# Patient Record
Sex: Female | Born: 1975 | Race: White | Hispanic: No | Smoking: Never smoker
Health system: Southern US, Community
[De-identification: ages and names within clinical notes are randomized; demographics above are authoritative.]

## PROBLEM LIST (undated history)

## (undated) DIAGNOSIS — R112 Nausea with vomiting, unspecified: Secondary | ICD-10-CM

## (undated) DIAGNOSIS — E039 Hypothyroidism, unspecified: Secondary | ICD-10-CM

## (undated) DIAGNOSIS — Z8489 Family history of other specified conditions: Secondary | ICD-10-CM

## (undated) DIAGNOSIS — R011 Cardiac murmur, unspecified: Secondary | ICD-10-CM

## (undated) DIAGNOSIS — Z9889 Other specified postprocedural states: Secondary | ICD-10-CM

## (undated) DIAGNOSIS — N809 Endometriosis, unspecified: Secondary | ICD-10-CM

## (undated) DIAGNOSIS — R339 Retention of urine, unspecified: Secondary | ICD-10-CM

## (undated) DIAGNOSIS — M2550 Pain in unspecified joint: Secondary | ICD-10-CM

## (undated) DIAGNOSIS — J189 Pneumonia, unspecified organism: Secondary | ICD-10-CM

## (undated) DIAGNOSIS — R51 Headache: Secondary | ICD-10-CM

## (undated) DIAGNOSIS — F419 Anxiety disorder, unspecified: Secondary | ICD-10-CM

## (undated) HISTORY — PX: TUBAL LIGATION: SHX77

## (undated) HISTORY — PX: DIAGNOSTIC LAPAROSCOPY: SUR761

## (undated) HISTORY — PX: MM BONE DENSITY DEX AXIAL/SKEL (ARMC HX): HXRAD1060

## (undated) HISTORY — PX: BACK SURGERY: SHX140

## (undated) HISTORY — PX: TONSILLECTOMY: SUR1361

---

## 1997-09-11 ENCOUNTER — Other Ambulatory Visit: Admission: RE | Admit: 1997-09-11 | Discharge: 1997-09-11 | Payer: Self-pay | Admitting: Obstetrics and Gynecology

## 1997-10-23 ENCOUNTER — Ambulatory Visit (HOSPITAL_COMMUNITY): Admission: RE | Admit: 1997-10-23 | Discharge: 1997-10-23 | Payer: Self-pay | Admitting: Obstetrics and Gynecology

## 1997-10-24 ENCOUNTER — Other Ambulatory Visit: Admission: RE | Admit: 1997-10-24 | Discharge: 1997-10-24 | Payer: Self-pay | Admitting: Family Medicine

## 1997-11-02 ENCOUNTER — Ambulatory Visit (HOSPITAL_COMMUNITY): Admission: RE | Admit: 1997-11-02 | Discharge: 1997-11-02 | Payer: Self-pay | Admitting: Obstetrics and Gynecology

## 2000-01-04 ENCOUNTER — Other Ambulatory Visit: Admission: RE | Admit: 2000-01-04 | Discharge: 2000-01-04 | Payer: Self-pay | Admitting: Obstetrics and Gynecology

## 2001-04-17 ENCOUNTER — Other Ambulatory Visit: Admission: RE | Admit: 2001-04-17 | Discharge: 2001-04-17 | Payer: Self-pay | Admitting: Obstetrics and Gynecology

## 2001-10-02 ENCOUNTER — Inpatient Hospital Stay (HOSPITAL_COMMUNITY): Admission: AD | Admit: 2001-10-02 | Discharge: 2001-10-02 | Payer: Self-pay | Admitting: Obstetrics and Gynecology

## 2001-10-03 ENCOUNTER — Inpatient Hospital Stay (HOSPITAL_COMMUNITY): Admission: AD | Admit: 2001-10-03 | Discharge: 2001-10-03 | Payer: Self-pay | Admitting: *Deleted

## 2001-10-15 ENCOUNTER — Inpatient Hospital Stay (HOSPITAL_COMMUNITY): Admission: AD | Admit: 2001-10-15 | Discharge: 2001-10-15 | Payer: Self-pay | Admitting: Obstetrics and Gynecology

## 2001-11-12 ENCOUNTER — Inpatient Hospital Stay (HOSPITAL_COMMUNITY): Admission: AD | Admit: 2001-11-12 | Discharge: 2001-11-14 | Payer: Self-pay | Admitting: Obstetrics and Gynecology

## 2001-12-20 ENCOUNTER — Other Ambulatory Visit: Admission: RE | Admit: 2001-12-20 | Discharge: 2001-12-20 | Payer: Self-pay | Admitting: Obstetrics and Gynecology

## 2003-05-05 ENCOUNTER — Other Ambulatory Visit: Admission: RE | Admit: 2003-05-05 | Discharge: 2003-05-05 | Payer: Self-pay | Admitting: Obstetrics and Gynecology

## 2004-11-28 ENCOUNTER — Inpatient Hospital Stay (HOSPITAL_COMMUNITY): Admission: AD | Admit: 2004-11-28 | Discharge: 2004-11-30 | Payer: Self-pay | Admitting: Obstetrics and Gynecology

## 2007-01-24 ENCOUNTER — Encounter: Admission: RE | Admit: 2007-01-24 | Discharge: 2007-01-31 | Payer: Self-pay | Admitting: Family Medicine

## 2008-05-22 HISTORY — PX: CHOLECYSTECTOMY: SHX55

## 2008-06-11 ENCOUNTER — Encounter: Admission: RE | Admit: 2008-06-11 | Discharge: 2008-06-11 | Payer: Self-pay | Admitting: Family Medicine

## 2009-07-21 IMAGING — US US ABDOMEN COMPLETE
1 series · 13 of 25 positions shown · non-contrast
Comparison: None

CLINICAL DATA: 2 months right upper quadrant pain. History
endometriosis.

ABDOMEN ULTRASOUND
TECHNIQUE: Complete abdominal ultrasound examination was performed
including evaluation of the liver, gallbladder, bile ducts,
pancreas, kidneys, spleen, IVC, and abdominal aorta.

[Series 1: us abdomen complete · 0.23mm/px · 13 of 97 slices shown]
[im 1/97]
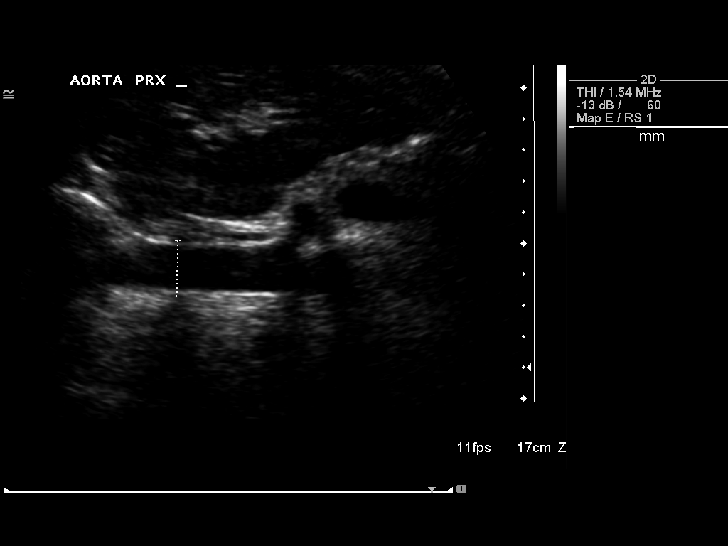
[im 9/97]
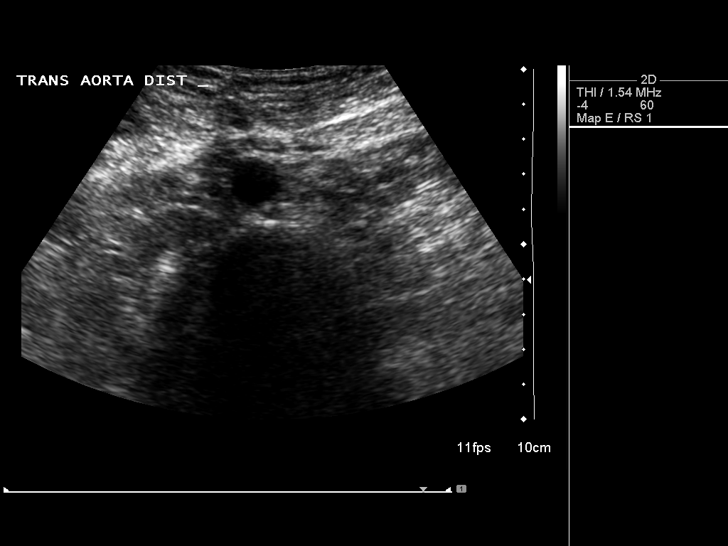
[im 17/97]
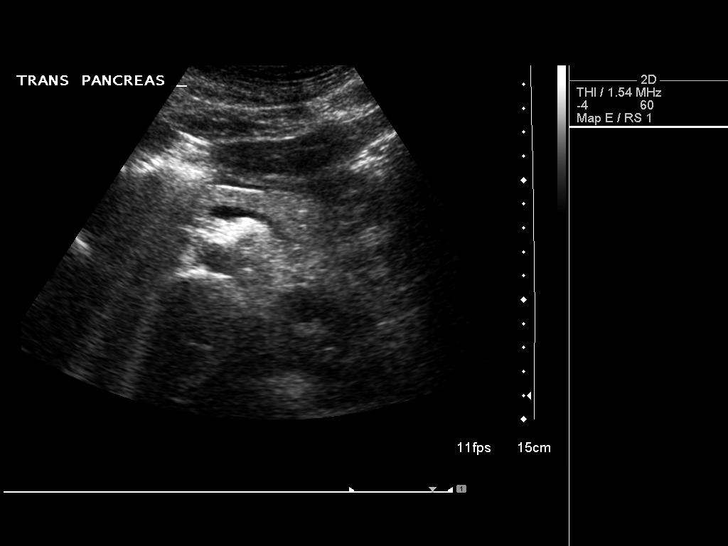
[im 25/97]
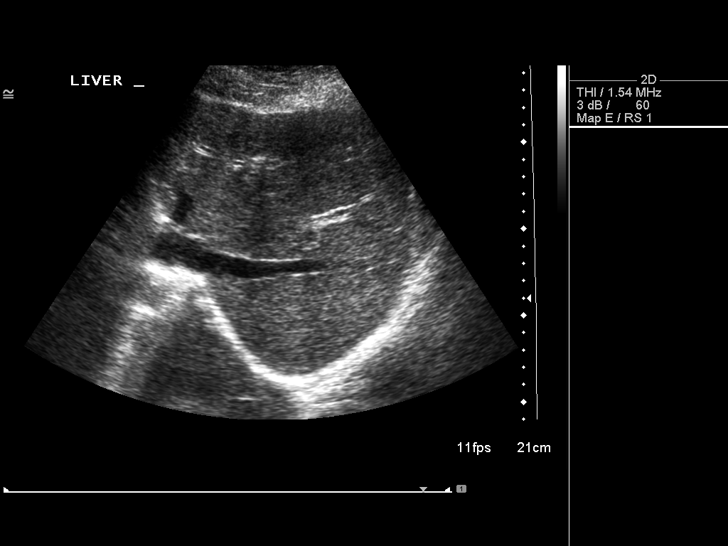
[im 33/97]
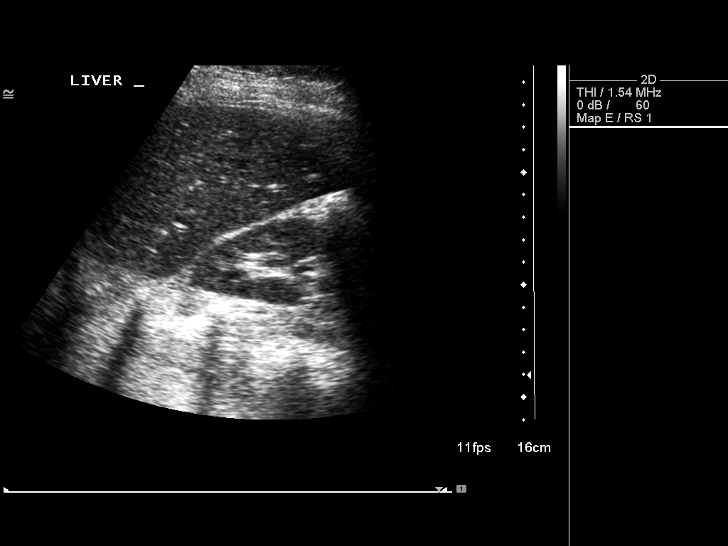
[im 41/97]
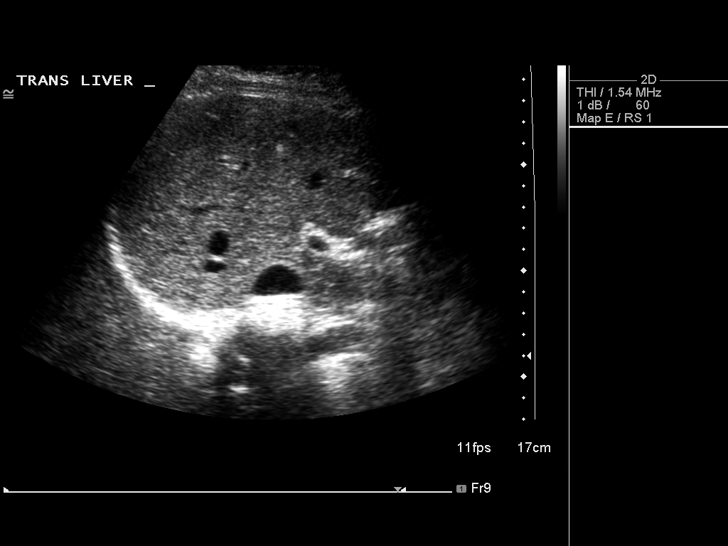
[im 49/97]
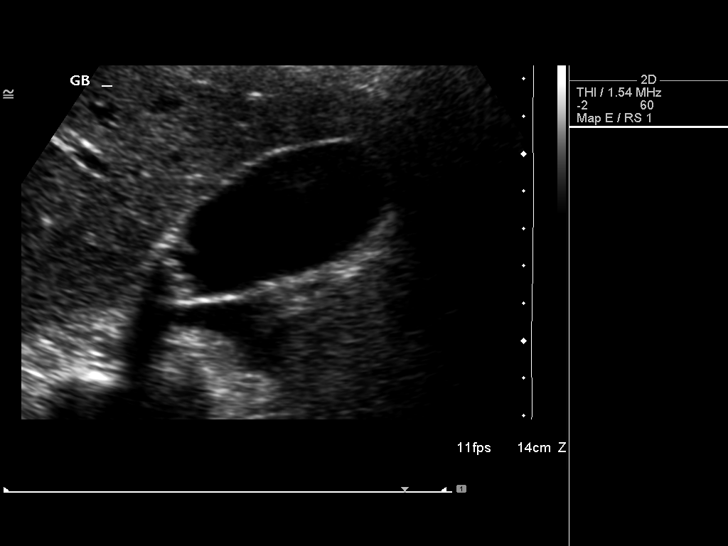
[im 57/97]
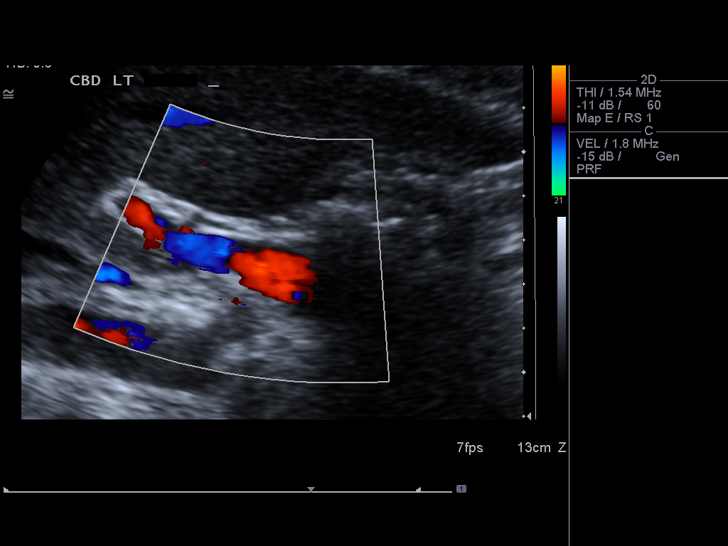
[im 65/97]
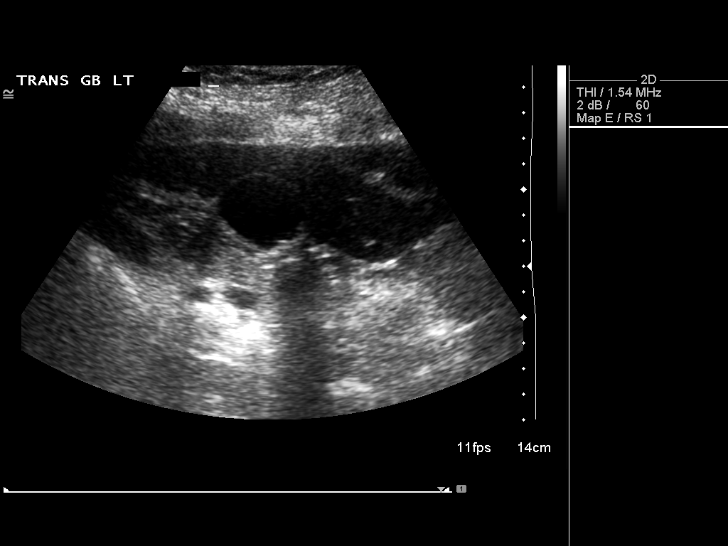
[im 73/97]
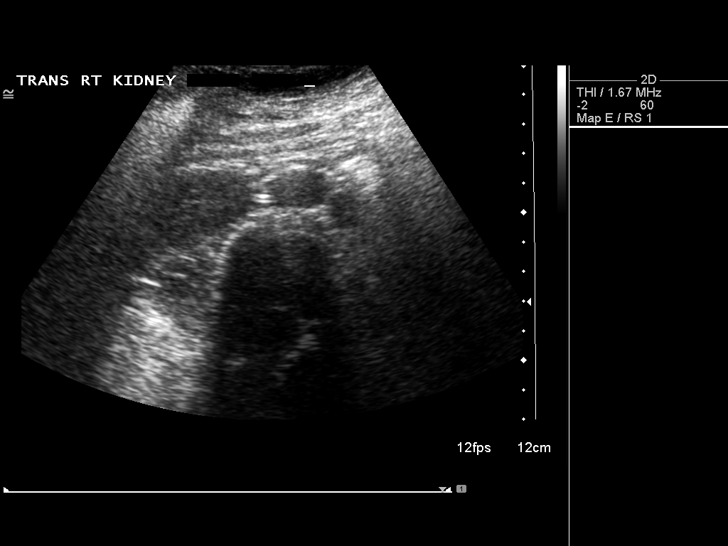
[im 81/97]
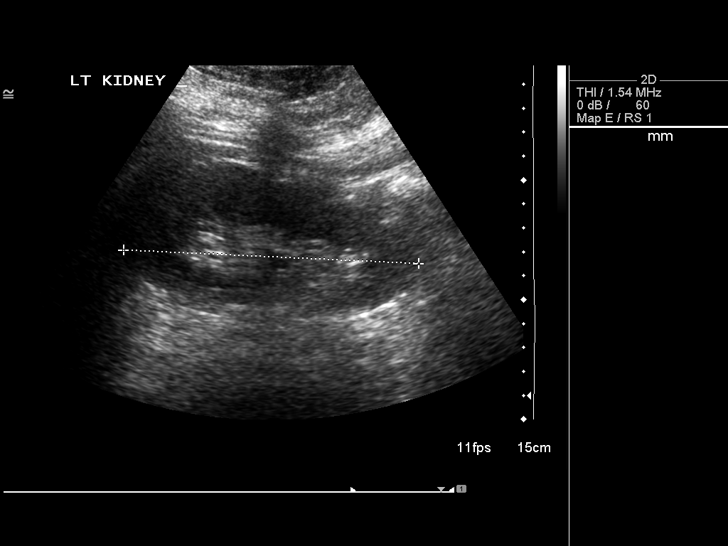
[im 89/97]
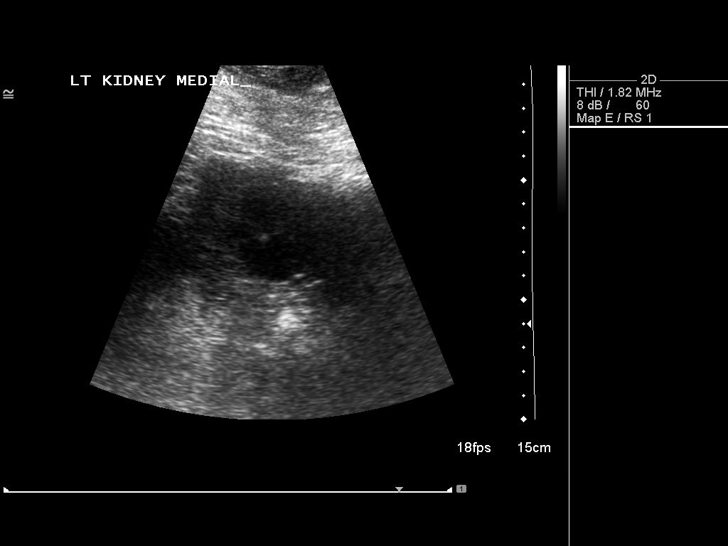
[im 97/97]
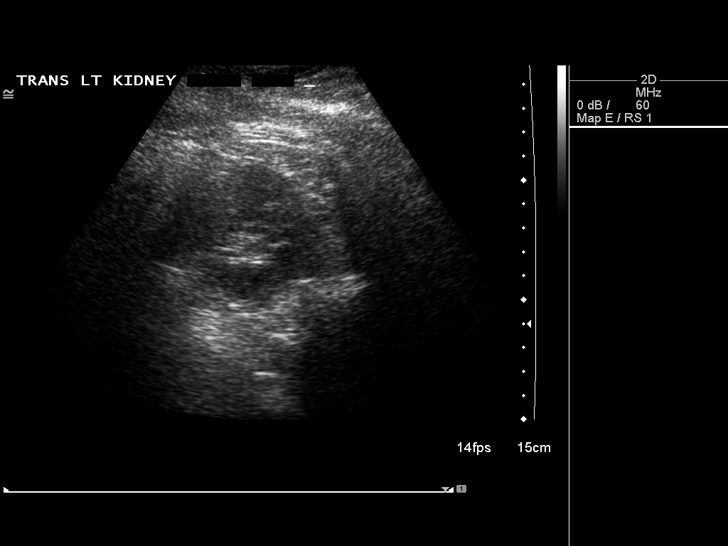

[13 of 25 positions shown; findings below may reference images not displayed]

FINDINGS: Slight (3 mm and less) nonobstructing gallstones are
seen.  Gallbladder is otherwise sonographically normal with wall
thickness 1 mm and no sonographic Murphy's sign.  No dilated
intrahepatic or extrahepatic bile ducts are seen with common bile
duct measuring normally at 2 mm.  At the central right lobe liver
is marginated solitary 5 mm echogenic focus consistent with
incidental hemangioma.  Liver size is normal with no other focal
lesions.  Pancreatic head and tail were obscured due to overlying
gastrointestinal gas.  Remaining inferior vena cava, pancreas,
spleen (7.1 cm long), right kidney (12 cm long), and abdominal
aorta (maximum diameter 1.7 cm) appear sonographically normal.  At
the medial mid left kidney is either anatomic variant extrarenal
pelvis measuring up to 2.7 cm long X 1.9 cm AP X 2.3 cm wide versus
medial exophytic cyst.  No free fluid seen.
IMPRESSION: 1.  Nonobstructing cholelithiasis.
2.  Limited visualization pancreatic head and tail.
3.  Probable incidental 5 mm liver hemangioma.  Recommend follow-up
targeted liver ultrasound in 6-12 months for further evaluation.
4.  Probable anatomic variant extrarenal pelvis mid left kidney
with 2.7 cm medial exophytic renal cyst possible.
5.  Otherwise, negative.

## 2011-12-14 ENCOUNTER — Other Ambulatory Visit: Payer: Self-pay | Admitting: Obstetrics and Gynecology

## 2011-12-22 ENCOUNTER — Encounter (HOSPITAL_COMMUNITY): Payer: Self-pay

## 2011-12-26 ENCOUNTER — Encounter (HOSPITAL_COMMUNITY): Payer: Self-pay

## 2011-12-26 ENCOUNTER — Encounter (HOSPITAL_COMMUNITY)
Admission: RE | Admit: 2011-12-26 | Discharge: 2011-12-26 | Disposition: A | Discharge status: Home / Self Care | Payer: Commercial Managed Care - PPO | Source: Ambulatory Visit | Attending: Obstetrics and Gynecology | Admitting: Obstetrics and Gynecology

## 2011-12-26 HISTORY — DX: Cardiac murmur, unspecified: R01.1

## 2011-12-26 HISTORY — DX: Anxiety disorder, unspecified: F41.9

## 2011-12-26 HISTORY — DX: Headache: R51

## 2011-12-26 HISTORY — DX: Other specified postprocedural states: Z98.890

## 2011-12-26 HISTORY — DX: Nausea with vomiting, unspecified: R11.2

## 2011-12-26 HISTORY — DX: Hypothyroidism, unspecified: E03.9

## 2011-12-26 LAB — CBC
Hemoglobin: 11.8 g/dL — ABNORMAL LOW (ref 12.0–15.0)
Platelets: 261 10*3/uL (ref 150–400)
RBC: 3.9 MIL/uL (ref 3.87–5.11)
WBC: 4.9 10*3/uL (ref 4.0–10.5)

## 2011-12-26 LAB — SURGICAL PCR SCREEN
MRSA, PCR: NEGATIVE
Staphylococcus aureus: NEGATIVE

## 2011-12-26 NOTE — Patient Instructions (Addendum)
Your procedure is scheduled on:01/04/12  Enter through the Main Entrance at :1130 am Pick up desk phone and dial [phone] and inform us of your arrival.  Please call [phone] if you have any problems the morning of surgery.  Remember: Do not eat after midnight:Wed. Do not drink after:9am on Thursday- see clear liquids sheet  Take these meds the morning of surgery with a sip of water: Synthroid  DO NOT wear jewelry, eye make-up, lipstick,body lotion, or dark fingernail polish. Do not shave for 48 hours prior to surgery.  If you are to be admitted after surgery, leave suitcase in car until your room has been assigned. Patients discharged on the day of surgery will not be allowed to drive home.   Remember to use your Hibiclens as instructed.

## 2012-01-04 ENCOUNTER — Encounter (HOSPITAL_COMMUNITY)
Admission: RE | Disposition: A | Discharge status: Home / Self Care | Payer: Self-pay | Source: Ambulatory Visit | Attending: Obstetrics and Gynecology

## 2012-01-04 ENCOUNTER — Ambulatory Visit (HOSPITAL_COMMUNITY)
Admission: RE | Admit: 2012-01-04 | Discharge: 2012-01-04 | Disposition: A | Discharge status: Home / Self Care | Payer: Commercial Managed Care - PPO | Source: Ambulatory Visit | Attending: Obstetrics and Gynecology | Admitting: Obstetrics and Gynecology

## 2012-01-04 ENCOUNTER — Encounter (HOSPITAL_COMMUNITY): Payer: Self-pay | Admitting: Anesthesiology

## 2012-01-04 ENCOUNTER — Encounter (HOSPITAL_COMMUNITY): Payer: Self-pay | Admitting: *Deleted

## 2012-01-04 ENCOUNTER — Ambulatory Visit (HOSPITAL_COMMUNITY): Payer: Commercial Managed Care - PPO | Admitting: Anesthesiology

## 2012-01-04 DIAGNOSIS — N803 Endometriosis of pelvic peritoneum, unspecified: Secondary | ICD-10-CM | POA: Insufficient documentation

## 2012-01-04 DIAGNOSIS — Z01812 Encounter for preprocedural laboratory examination: Secondary | ICD-10-CM | POA: Insufficient documentation

## 2012-01-04 DIAGNOSIS — R1031 Right lower quadrant pain: Secondary | ICD-10-CM | POA: Insufficient documentation

## 2012-01-04 DIAGNOSIS — Z01818 Encounter for other preprocedural examination: Secondary | ICD-10-CM | POA: Insufficient documentation

## 2012-01-04 DIAGNOSIS — N949 Unspecified condition associated with female genital organs and menstrual cycle: Secondary | ICD-10-CM | POA: Insufficient documentation

## 2012-01-04 DIAGNOSIS — N920 Excessive and frequent menstruation with regular cycle: Secondary | ICD-10-CM | POA: Insufficient documentation

## 2012-01-04 DIAGNOSIS — Z9889 Other specified postprocedural states: Secondary | ICD-10-CM

## 2012-01-04 HISTORY — PX: HYSTEROSCOPY W/D&C: SHX1775

## 2012-01-04 HISTORY — PX: LAPAROSCOPY: SHX197

## 2012-01-04 LAB — PREGNANCY, URINE: Preg Test, Ur: NEGATIVE

## 2012-01-04 SURGERY — DILATATION AND CURETTAGE /HYSTEROSCOPY
Anesthesia: General

## 2012-01-04 MED ORDER — DEXAMETHASONE SODIUM PHOSPHATE 4 MG/ML IJ SOLN
INTRAMUSCULAR | Status: DC | PRN
  Administered 2012-01-04: 4 mg via INTRAVENOUS

## 2012-01-04 MED ORDER — SCOPOLAMINE 1 MG/3DAYS TD PT72
1.0000 | MEDICATED_PATCH | Freq: Once | TRANSDERMAL | Status: AC
  Administered 2012-01-04: 1 via TRANSDERMAL
  Administered 2012-01-04: 1.5 mg via TRANSDERMAL

## 2012-01-04 MED ORDER — GLYCOPYRROLATE 0.2 MG/ML IJ SOLN
INTRAMUSCULAR | Status: AC
  Filled 2012-01-04: qty 1

## 2012-01-04 MED ORDER — FENTANYL CITRATE 0.05 MG/ML IJ SOLN
INTRAMUSCULAR | Status: DC | PRN
  Administered 2012-01-04: 100 ug via INTRAVENOUS
  Administered 2012-01-04 (×2): 50 ug via INTRAVENOUS

## 2012-01-04 MED ORDER — EPHEDRINE SULFATE 50 MG/ML IJ SOLN
INTRAMUSCULAR | Status: DC | PRN
  Administered 2012-01-04: 5 mg via INTRAVENOUS

## 2012-01-04 MED ORDER — KETOROLAC TROMETHAMINE 30 MG/ML IJ SOLN
INTRAMUSCULAR | Status: DC | PRN
  Administered 2012-01-04: 30 mg via INTRAVENOUS

## 2012-01-04 MED ORDER — ROCURONIUM BROMIDE 100 MG/10ML IV SOLN
INTRAVENOUS | Status: DC | PRN
  Administered 2012-01-04: 40 mg via INTRAVENOUS

## 2012-01-04 MED ORDER — NEOSTIGMINE METHYLSULFATE 1 MG/ML IJ SOLN
INTRAMUSCULAR | Status: DC | PRN
  Administered 2012-01-04: 4 mg via INTRAVENOUS

## 2012-01-04 MED ORDER — ONDANSETRON HCL 4 MG/2ML IJ SOLN
INTRAMUSCULAR | Status: AC
  Filled 2012-01-04: qty 2

## 2012-01-04 MED ORDER — MIDAZOLAM HCL 2 MG/2ML IJ SOLN
INTRAMUSCULAR | Status: AC
  Filled 2012-01-04: qty 2

## 2012-01-04 MED ORDER — KETOROLAC TROMETHAMINE 30 MG/ML IJ SOLN
INTRAMUSCULAR | Status: AC
  Filled 2012-01-04: qty 1

## 2012-01-04 MED ORDER — EPHEDRINE 5 MG/ML INJ
INTRAVENOUS | Status: AC
  Filled 2012-01-04: qty 10

## 2012-01-04 MED ORDER — FENTANYL CITRATE 0.05 MG/ML IJ SOLN
25.0000 ug | INTRAMUSCULAR | Status: DC | PRN
  Administered 2012-01-04 (×2): 50 ug via INTRAVENOUS

## 2012-01-04 MED ORDER — NEOSTIGMINE METHYLSULFATE 1 MG/ML IJ SOLN
INTRAMUSCULAR | Status: AC
  Filled 2012-01-04: qty 10

## 2012-01-04 MED ORDER — DEXAMETHASONE SODIUM PHOSPHATE 10 MG/ML IJ SOLN
INTRAMUSCULAR | Status: AC
  Filled 2012-01-04: qty 1

## 2012-01-04 MED ORDER — METOCLOPRAMIDE HCL 5 MG/ML IJ SOLN: 10.0000 mg | Freq: Once | INTRAMUSCULAR | Status: DC | PRN

## 2012-01-04 MED ORDER — MIDAZOLAM HCL 5 MG/5ML IJ SOLN
INTRAMUSCULAR | Status: DC | PRN
  Administered 2012-01-04 (×2): 1 mg via INTRAVENOUS

## 2012-01-04 MED ORDER — MEPERIDINE HCL 25 MG/ML IJ SOLN
6.2500 mg | INTRAMUSCULAR | Status: DC | PRN
Start: 2012-01-04 — End: 2012-01-04

## 2012-01-04 MED ORDER — BUPIVACAINE HCL (PF) 0.25 % IJ SOLN
INTRAMUSCULAR | Status: AC
  Filled 2012-01-04: qty 60

## 2012-01-04 MED ORDER — ROCURONIUM BROMIDE 50 MG/5ML IV SOLN
INTRAVENOUS | Status: AC
  Filled 2012-01-04: qty 1

## 2012-01-04 MED ORDER — GLYCOPYRROLATE 0.2 MG/ML IJ SOLN
INTRAMUSCULAR | Status: DC | PRN
  Administered 2012-01-04: .15 mg via INTRAVENOUS
  Administered 2012-01-04: .7 mg via INTRAVENOUS

## 2012-01-04 MED ORDER — CHLOROPROCAINE HCL 1 % IJ SOLN
INTRAMUSCULAR | Status: AC
  Filled 2012-01-04: qty 30

## 2012-01-04 MED ORDER — LACTATED RINGERS IV SOLN
INTRAVENOUS | Status: DC
  Administered 2012-01-04 (×2): via INTRAVENOUS

## 2012-01-04 MED ORDER — LIDOCAINE HCL (CARDIAC) 20 MG/ML IV SOLN
INTRAVENOUS | Status: AC
  Filled 2012-01-04: qty 5

## 2012-01-04 MED ORDER — PROPOFOL 10 MG/ML IV EMUL
INTRAVENOUS | Status: DC | PRN
  Administered 2012-01-04: 200 mg via INTRAVENOUS

## 2012-01-04 MED ORDER — PROPOFOL 10 MG/ML IV EMUL
INTRAVENOUS | Status: AC
  Filled 2012-01-04: qty 20

## 2012-01-04 MED ORDER — ONDANSETRON HCL 4 MG/2ML IJ SOLN
INTRAMUSCULAR | Status: DC | PRN
  Administered 2012-01-04: 4 mg via INTRAVENOUS

## 2012-01-04 MED ORDER — SCOPOLAMINE 1 MG/3DAYS TD PT72
MEDICATED_PATCH | TRANSDERMAL | Status: AC
  Administered 2012-01-04: 1.5 mg via TRANSDERMAL
  Filled 2012-01-04: qty 1

## 2012-01-04 MED ORDER — LIDOCAINE HCL (CARDIAC) 20 MG/ML IV SOLN
INTRAVENOUS | Status: DC | PRN
  Administered 2012-01-04: 80 mg via INTRAVENOUS

## 2012-01-04 MED ORDER — FENTANYL CITRATE 0.05 MG/ML IJ SOLN
INTRAMUSCULAR | Status: AC
  Filled 2012-01-04: qty 2

## 2012-01-04 MED ORDER — FENTANYL CITRATE 0.05 MG/ML IJ SOLN
INTRAMUSCULAR | Status: AC
  Filled 2012-01-04: qty 5

## 2012-01-04 SURGICAL SUPPLY — 71 items
APPLICATOR COTTON TIP 6IN STRL (MISCELLANEOUS) ×2 IMPLANT
BAG URINE DRAINAGE (UROLOGICAL SUPPLIES) ×2 IMPLANT
BARRIER ADHS 3X4 INTERCEED (GAUZE/BANDAGES/DRESSINGS) ×2 IMPLANT
CABLE HIGH FREQUENCY MONO STRZ (ELECTRODE) ×2 IMPLANT
CANISTER SUCTION 2500CC (MISCELLANEOUS) ×2 IMPLANT
CATH FOLEY 3WAY  5CC 16FR (CATHETERS) ×1
CATH FOLEY 3WAY 5CC 16FR (CATHETERS) ×1 IMPLANT
CATH ROBINSON RED A/P 16FR (CATHETERS) ×2 IMPLANT
CHLORAPREP W/TINT 26ML (MISCELLANEOUS) ×2 IMPLANT
CLOTH BEACON ORANGE TIMEOUT ST (SAFETY) ×2 IMPLANT
CONT PATH 16OZ SNAP LID 3702 (MISCELLANEOUS) ×2 IMPLANT
CONTAINER PREFILL 10% NBF 60ML (FORM) ×4 IMPLANT
COVER MAYO STAND STRL (DRAPES) ×2 IMPLANT
COVER TABLE BACK 60X90 (DRAPES) ×4 IMPLANT
COVER TIP SHEARS 8 DVNC (MISCELLANEOUS) ×1 IMPLANT
COVER TIP SHEARS 8MM DA VINCI (MISCELLANEOUS) ×1
DERMABOND ADVANCED (GAUZE/BANDAGES/DRESSINGS) ×1
DERMABOND ADVANCED .7 DNX12 (GAUZE/BANDAGES/DRESSINGS) ×1 IMPLANT
DRAPE HUG U DISPOSABLE (DRAPE) ×2 IMPLANT
DRAPE LG THREE QUARTER DISP (DRAPES) ×4 IMPLANT
DRAPE MONITOR DA VINCI (DRAPE) IMPLANT
DRAPE WARM FLUID 44X44 (DRAPE) ×2 IMPLANT
ELECT REM PT RETURN 9FT ADLT (ELECTROSURGICAL) ×2
ELECTRODE REM PT RTRN 9FT ADLT (ELECTROSURGICAL) ×1 IMPLANT
EVACUATOR SMOKE 8.L (FILTER) ×2 IMPLANT
FORCEPS CUTTING 45CM 5MM (CUTTING FORCEPS) IMPLANT
GAUZE SPONGE 4X4 16PLY XRAY LF (GAUZE/BANDAGES/DRESSINGS) ×2 IMPLANT
GLOVE BIO SURGEON STRL SZ 6.5 (GLOVE) ×4 IMPLANT
GLOVE BIOGEL PI IND STRL 7.0 (GLOVE) ×3 IMPLANT
GLOVE BIOGEL PI INDICATOR 7.0 (GLOVE) ×3
GOWN PREVENTION PLUS LG XLONG (DISPOSABLE) ×6 IMPLANT
GOWN STRL REIN XL XLG (GOWN DISPOSABLE) ×12 IMPLANT
KIT ACCESSORY DA VINCI DISP (KITS) ×1
KIT ACCESSORY DVNC DISP (KITS) ×1 IMPLANT
KIT DISP ACCESSORY 4 ARM (KITS) IMPLANT
LOOP ANGLED CUTTING 22FR (CUTTING LOOP) IMPLANT
NEEDLE INSUFFLATION 14GA 120MM (NEEDLE) ×2 IMPLANT
NS IRRIG 1000ML POUR BTL (IV SOLUTION) ×6 IMPLANT
OCCLUDER COLPOPNEUMO (BALLOONS) IMPLANT
PACK LAVH (CUSTOM PROCEDURE TRAY) ×2 IMPLANT
PAD PREP 24X48 CUFFED NSTRL (MISCELLANEOUS) ×4 IMPLANT
PLUG CATH AND CAP STER (CATHETERS) ×2 IMPLANT
POUCH SPECIMEN RETRIEVAL 10MM (ENDOMECHANICALS) IMPLANT
PROTECTOR NERVE ULNAR (MISCELLANEOUS) ×4 IMPLANT
SCISSORS LAP 5X35 DISP (ENDOMECHANICALS) IMPLANT
SET CYSTO W/LG BORE CLAMP LF (SET/KITS/TRAYS/PACK) IMPLANT
SET IRRIG TUBING LAPAROSCOPIC (IRRIGATION / IRRIGATOR) ×4 IMPLANT
SOLUTION ELECTROLUBE (MISCELLANEOUS) ×2 IMPLANT
SPONGE LAP 18X18 X RAY DECT (DISPOSABLE) IMPLANT
SUT VIC AB 0 CT1 27 (SUTURE) ×5
SUT VIC AB 0 CT1 27XBRD ANTBC (SUTURE) ×5 IMPLANT
SUT VICRYL 0 UR6 27IN ABS (SUTURE) ×2 IMPLANT
SUT VICRYL 4-0 PS2 18IN ABS (SUTURE) ×4 IMPLANT
SYR 50ML LL SCALE MARK (SYRINGE) ×2 IMPLANT
SYSTEM CONVERTIBLE TROCAR (TROCAR) IMPLANT
TIP UTERINE 5.1X6CM LAV DISP (MISCELLANEOUS) IMPLANT
TIP UTERINE 6.7X10CM GRN DISP (MISCELLANEOUS) IMPLANT
TIP UTERINE 6.7X6CM WHT DISP (MISCELLANEOUS) IMPLANT
TIP UTERINE 6.7X8CM BLUE DISP (MISCELLANEOUS) IMPLANT
TOWEL OR 17X24 6PK STRL BLUE (TOWEL DISPOSABLE) ×6 IMPLANT
TROCAR 12M 150ML BLUNT (TROCAR) IMPLANT
TROCAR BALLN 12MMX100 BLUNT (TROCAR) IMPLANT
TROCAR DISP BLADELESS 8 DVNC (TROCAR) IMPLANT
TROCAR DISP BLADELESS 8MM (TROCAR)
TROCAR Z-THREAD 12X150 (TROCAR) ×2 IMPLANT
TROCAR Z-THREAD BLADED 11X100M (TROCAR) ×2 IMPLANT
TROCAR Z-THREAD BLADED 12X100M (TROCAR) ×2 IMPLANT
TROCAR Z-THREAD BLADED 5X100MM (TROCAR) ×4 IMPLANT
TUBING FILTER THERMOFLATOR (ELECTROSURGICAL) ×2 IMPLANT
WARMER LAPAROSCOPE (MISCELLANEOUS) ×2 IMPLANT
WATER STERILE IRR 1000ML POUR (IV SOLUTION) ×2 IMPLANT

## 2012-01-04 NOTE — Anesthesia Postprocedure Evaluation (Signed)
  Anesthesia Post-op Note  Patient: Marcia Kirk  Procedure(s) Performed: Procedure(s) (LRB): DILATATION AND CURETTAGE /HYSTEROSCOPY (N/A) LAPAROSCOPY DIAGNOSTIC (N/A)  Patient is awake and responsive. Pain and nausea are reasonably well controlled. Vital signs are stable and clinically acceptable. Oxygen saturation is clinically acceptable. There are no apparent anesthetic complications at this time. Patient is ready for discharge.

## 2012-01-04 NOTE — Brief Op Note (Signed)
01/04/2012  2:42 PM  PATIENT:  Marcia Kirk  36 y.o. female  PRE-OPERATIVE DIAGNOSIS:  Pelvic Pain/RLQ pain, hx Endometriosis; Menorrhagia  POST-OPERATIVE DIAGNOSIS:  Pelvic Pain/RLQ pain, hx endometriosis Menorrhagia  PROCEDURE:  Procedure(s) (LRB): DILATATION AND CURETTAGE / DIAGNOSTIC HYSTEROSCOPY (N/A) LAPAROSCOPY DIAGNOSTIC (N/A) via Robotic camera  SURGEON:  Surgeon(s) and Role:    * Kaly Mcquary Cathie Beams, MD - Primary  PHYSICIAN ASSISTANT:   ASSISTANTS: Arlan Organ, CNM   ANESTHESIA:   general  Findings:  Nl ovaries, nl uterus, nl appendix, nl liver edge. No endometriosis  EBL:  Total I/O In: 1000 [I.V.:1000] Out: 205 [Urine:200; Blood:5]  BLOOD ADMINISTERED:none  DRAINS: none   LOCAL MEDICATIONS USED:  MARCAINE     SPECIMEN:  Source of Specimen:  emc  DISPOSITION OF SPECIMEN:  PATHOLOGY  COUNTS:  YES  TOURNIQUET:  * No tourniquets in log *  DICTATION: .Other Dictation: Dictation Number   PLAN OF CARE: Discharge to home after PACU  PATIENT DISPOSITION:  PACU - hemodynamically stable.   Delay start of Pharmacological VTE agent (>24hrs) due to surgical blood loss or risk of bleeding: no

## 2012-01-04 NOTE — Preoperative (Signed)
Beta Blockers   Reason not to administer Beta Blockers:Not Applicable 

## 2012-01-04 NOTE — Transfer of Care (Signed)
Immediate Anesthesia Transfer of Care Note  Patient: Marcia Kirk  Procedure(s) Performed: Procedure(s) (LRB): DILATATION AND CURETTAGE /HYSTEROSCOPY (N/A) LAPAROSCOPY DIAGNOSTIC (N/A)  Patient Location: PACU  Anesthesia Type: General  Level of Consciousness: awake, alert  and oriented  Airway & Oxygen Therapy: Patient Spontanous Breathing and Patient connected to nasal cannula oxygen  Post-op Assessment: Report given to PACU RN and Post -op Vital signs reviewed and stable  Post vital signs: Reviewed and stable  Complications: No apparent anesthesia complications

## 2012-01-04 NOTE — Anesthesia Preprocedure Evaluation (Signed)
Anesthesia Evaluation  Patient identified by MRN, date of birth, ID band Patient awake    Reviewed: Allergy & Precautions, H&P , NPO status , Patient's Chart, lab work & pertinent test results  History of Anesthesia Complications (+) PONV  Airway Mallampati: III TM Distance: >3 FB Neck ROM: Full    Dental No notable dental hx. (+) Teeth Intact   Pulmonary neg pulmonary ROS,  breath sounds clear to auscultation  Pulmonary exam normal       Cardiovascular negative cardio ROS  + Valvular Problems/Murmurs Rhythm:Regular Rate:Normal     Neuro/Psych  Headaches, Anxiety    GI/Hepatic negative GI ROS, Neg liver ROS,   Endo/Other  Hypothyroidism   Renal/GU negative Renal ROS  negative genitourinary   Musculoskeletal negative musculoskeletal ROS (+)   Abdominal   Peds  Hematology negative hematology ROS (+)   Anesthesia Other Findings   Reproductive/Obstetrics negative OB ROS                           Anesthesia Physical Anesthesia Plan  ASA: II  Anesthesia Plan: General   Post-op Pain Management:    Induction: Intravenous  Airway Management Planned: Oral ETT  Additional Equipment:   Intra-op Plan:   Post-operative Plan: Extubation in OR  Informed Consent: I have reviewed the patients History and Physical, chart, labs and discussed the procedure including the risks, benefits and alternatives for the proposed anesthesia with the patient or authorized representative who has indicated his/her understanding and acceptance.   Dental advisory given  Plan Discussed with: CRNA, Anesthesiologist and Surgeon  Anesthesia Plan Comments:         Anesthesia Quick Evaluation

## 2012-01-04 NOTE — Progress Notes (Signed)
Pt unable to void on 2 attempts, bladder scan shows 110cc, Dr Cherly Hensen notified, okay to discharge pt home, if no void in 6 hours call md or return to maternity admissions

## 2012-01-05 ENCOUNTER — Encounter (HOSPITAL_COMMUNITY): Payer: Self-pay | Admitting: Obstetrics and Gynecology

## 2012-01-05 NOTE — Op Note (Signed)
[name], Marcia Kirk                  ACCOUNT NO.:  [id number]  MEDICAL RECORD NO.:  [id number]  LOCATION:  [facility]                          FACILITY:  [facility]  PHYSICIAN:  Maxie Better, M.D.DATE OF BIRTH:  Aug 19, 1975  DATE OF PROCEDURE: DATE OF DISCHARGE:  01/04/2012                              OPERATIVE REPORT   PREOPERATIVE DIAGNOSES: 1. Pelvic pain/right lower quadrant pain. 2. History of pelvic endometriosis, menorrhagia.  PROCEDURE:  Operative laparoscopy using the robotic camera, diagnostic hysteroscopy, D and C.  POSTOPERATIVE DIAGNOSES: 1. Menorrhagia. 2. Right lower quadrant pain. 3. Pelvic pain. 4. History of pelvic endometriosis.  ANESTHESIA:  General.  SURGEON:  Maxie Better, M.D.  ASSISTANT:  Arlan Organ, CNM.  PROCEDURE:  Under adequate general anesthesia, the patient was placed in the dorsal lithotomy position.  She was positioned for the potential of a robotic surgery.  Examination under anesthesia revealed anteverted uterus.  No adnexal masses could be appreciated.  The patient was sterilely prepped and draped in the usual fashion.  A bivalve speculum was placed in the vagina.  Single-tooth tenaculum was placed on anterior lip of the cervix.  The cervix was easily accepted a #25 Pratt dilator. A diagnostic hysteroscope was introduced into the uterine cavity.  Both tubal ostia could be seen.  Fluffy endometrium was noted but otherwise no distinct polyps seen.  The hysteroscope was removed.  The cavity was curetted for large amount of tissue.  The hysteroscope was reinserted and additional soft tissue was noted and the procedure was repeated. Subsequently, the Hulka clip tenaculum was placed on the anterior lip of the cervix.  The bivalve speculum was removed.  An indwelling Foley catheter was sterilely placed.  Attention was then turned to the abdomen where a previous infraumbilical scar as noted.  0.25% Marcaine was injected at that site.  A  transverse incision was then made.  Veress needle was inserted, tested, and opening pressure of 5 was noted.  3 L of CO2 was insufflated.  Veress needle was removed.  A 12 mm disposable trocar was introduced into the abdomen without incident.  The robotic camera was then inserted.  Normal liver edge.  Appendix was normal. Small amount of bowel lesion on that right side.  Normal tubes and ovaries bilaterally.  Anterior and posterior cul-de-sac with fluid posteriorly from the hysteroscope, had no lesions noted.  At that point, given the findings a supraumbilical incision was then made, 5 mm port was placed under direct visualization.  The pelvis was further inspected.  No evidence of endometriosis noted at this time.  Therefore, all instruments were then removed.  The abdomen was deflated. Subcuticular stitch of 4-0 Vicryl was used and the fascia was closed with 0 Vicryl suture.  The instruments from the vagina was removed.  SPECIMENS:  Endometrial curettings sent to pathology.  ESTIMATED BLOOD LOSS:  Minimal.  COMPLICATION:  None.  The patient tolerated the procedure well and was transferred to recovery in stable condition.     Maxie Better, M.D.     [place]/MEDQ  D:  01/04/2012  T:  01/05/2012  Job:  [id number]

## 2014-12-17 ENCOUNTER — Other Ambulatory Visit: Payer: Self-pay | Admitting: Obstetrics and Gynecology

## 2014-12-17 ENCOUNTER — Encounter (HOSPITAL_COMMUNITY): Payer: Self-pay | Admitting: *Deleted

## 2014-12-31 ENCOUNTER — Encounter (HOSPITAL_COMMUNITY): Payer: Self-pay | Admitting: Anesthesiology

## 2014-12-31 NOTE — Anesthesia Preprocedure Evaluation (Addendum)
Anesthesia Evaluation  Patient identified by MRN, date of birth, ID band Patient awake    Reviewed: Allergy & Precautions, NPO status , Patient's Chart, lab work & pertinent test results  History of Anesthesia Complications (+) PONV and history of anesthetic complications  Airway Mallampati: II  TM Distance: >3 FB Neck ROM: Full    Dental no notable dental hx. (+) Chipped,    Pulmonary neg pulmonary ROS,  breath sounds clear to auscultation  Pulmonary exam normal       Cardiovascular negative cardio ROS Normal cardiovascular exam+ Valvular Problems/Murmurs Rhythm:Regular Rate:Normal     Neuro/Psych  Headaches, Anxiety    GI/Hepatic negative GI ROS, Neg liver ROS,   Endo/Other  Hypothyroidism   Renal/GU negative Renal ROS  negative genitourinary   Musculoskeletal negative musculoskeletal ROS (+)   Abdominal   Peds  Hematology   Anesthesia Other Findings   Reproductive/Obstetrics Menorrhagia Desires sterilization                            Anesthesia Physical Anesthesia Plan  ASA: II  Anesthesia Plan: General   Post-op Pain Management:    Induction: Intravenous  Airway Management Planned: Oral ETT  Additional Equipment:   Intra-op Plan:   Post-operative Plan: Extubation in OR  Informed Consent: I have reviewed the patients History and Physical, chart, labs and discussed the procedure including the risks, benefits and alternatives for the proposed anesthesia with the patient or authorized representative who has indicated his/her understanding and acceptance.   Dental advisory given  Plan Discussed with: CRNA, Anesthesiologist and Surgeon  Anesthesia Plan Comments:         Anesthesia Quick Evaluation

## 2015-01-01 ENCOUNTER — Encounter (HOSPITAL_COMMUNITY): Payer: Self-pay

## 2015-01-01 ENCOUNTER — Encounter (HOSPITAL_COMMUNITY)
Admission: RE | Disposition: A | Discharge status: Home / Self Care | Payer: Self-pay | Source: Ambulatory Visit | Attending: Obstetrics and Gynecology

## 2015-01-01 ENCOUNTER — Ambulatory Visit (HOSPITAL_COMMUNITY)
Admission: RE | Admit: 2015-01-01 | Discharge: 2015-01-01 | Disposition: A | Discharge status: Home / Self Care | Payer: Commercial Managed Care - PPO | Source: Ambulatory Visit | Attending: Obstetrics and Gynecology | Admitting: Obstetrics and Gynecology

## 2015-01-01 ENCOUNTER — Ambulatory Visit (HOSPITAL_COMMUNITY): Payer: Commercial Managed Care - PPO | Admitting: Anesthesiology

## 2015-01-01 DIAGNOSIS — G43909 Migraine, unspecified, not intractable, without status migrainosus: Secondary | ICD-10-CM | POA: Diagnosis not present

## 2015-01-01 DIAGNOSIS — F419 Anxiety disorder, unspecified: Secondary | ICD-10-CM | POA: Diagnosis not present

## 2015-01-01 DIAGNOSIS — Z30432 Encounter for removal of intrauterine contraceptive device: Secondary | ICD-10-CM | POA: Insufficient documentation

## 2015-01-01 DIAGNOSIS — N803 Endometriosis of pelvic peritoneum, unspecified: Secondary | ICD-10-CM

## 2015-01-01 DIAGNOSIS — R011 Cardiac murmur, unspecified: Secondary | ICD-10-CM | POA: Diagnosis not present

## 2015-01-01 DIAGNOSIS — Z302 Encounter for sterilization: Secondary | ICD-10-CM | POA: Diagnosis not present

## 2015-01-01 DIAGNOSIS — Z885 Allergy status to narcotic agent status: Secondary | ICD-10-CM | POA: Insufficient documentation

## 2015-01-01 DIAGNOSIS — N920 Excessive and frequent menstruation with regular cycle: Secondary | ICD-10-CM | POA: Insufficient documentation

## 2015-01-01 DIAGNOSIS — E039 Hypothyroidism, unspecified: Secondary | ICD-10-CM | POA: Insufficient documentation

## 2015-01-01 HISTORY — PX: LAPAROSCOPIC TUBAL LIGATION: SHX1937

## 2015-01-01 HISTORY — PX: DILITATION & CURRETTAGE/HYSTROSCOPY WITH NOVASURE ABLATION: SHX5568

## 2015-01-01 HISTORY — PX: IUD REMOVAL: SHX5392

## 2015-01-01 LAB — CBC
HCT: 37.3 % (ref 36.0–46.0)
Hemoglobin: 12.3 g/dL (ref 12.0–15.0)
MCH: 31.4 pg (ref 26.0–34.0)
MCHC: 33 g/dL (ref 30.0–36.0)
MCV: 95.2 fL (ref 78.0–100.0)
Platelets: 242 10*3/uL (ref 150–400)
RBC: 3.92 MIL/uL (ref 3.87–5.11)
RDW: 13.1 % (ref 11.5–15.5)
WBC: 5.7 10*3/uL (ref 4.0–10.5)

## 2015-01-01 LAB — PREGNANCY, URINE: PREG TEST UR: NEGATIVE

## 2015-01-01 SURGERY — DILATATION & CURETTAGE/HYSTEROSCOPY WITH NOVASURE ABLATION
Anesthesia: General

## 2015-01-01 MED ORDER — METOCLOPRAMIDE HCL 5 MG/ML IJ SOLN
10.0000 mg | Freq: Once | INTRAMUSCULAR | Status: AC | PRN
  Administered 2015-01-01: 10 mg via INTRAVENOUS

## 2015-01-01 MED ORDER — PROPOFOL 10 MG/ML IV BOLUS
INTRAVENOUS | Status: AC
  Filled 2015-01-01: qty 20

## 2015-01-01 MED ORDER — MIDAZOLAM HCL 2 MG/2ML IJ SOLN
INTRAMUSCULAR | Status: DC | PRN
Start: 2015-01-01 — End: 2015-01-01
  Administered 2015-01-01: 2 mg via INTRAVENOUS

## 2015-01-01 MED ORDER — NEOSTIGMINE METHYLSULFATE 10 MG/10ML IV SOLN
INTRAVENOUS | Status: DC | PRN
  Administered 2015-01-01: 2 mg via INTRAVENOUS

## 2015-01-01 MED ORDER — DEXAMETHASONE SODIUM PHOSPHATE 10 MG/ML IJ SOLN
INTRAMUSCULAR | Status: DC | PRN
  Administered 2015-01-01: 4 mg via INTRAVENOUS

## 2015-01-01 MED ORDER — FENTANYL CITRATE (PF) 100 MCG/2ML IJ SOLN
INTRAMUSCULAR | Status: AC
  Administered 2015-01-01: 25 ug via INTRAVENOUS
  Filled 2015-01-01: qty 2

## 2015-01-01 MED ORDER — FENTANYL CITRATE (PF) 250 MCG/5ML IJ SOLN
INTRAMUSCULAR | Status: AC
  Filled 2015-01-01: qty 25

## 2015-01-01 MED ORDER — LACTATED RINGERS IV SOLN
INTRAVENOUS | Status: DC
  Administered 2015-01-01 (×3): via INTRAVENOUS

## 2015-01-01 MED ORDER — LIDOCAINE HCL (CARDIAC) 20 MG/ML IV SOLN
INTRAVENOUS | Status: DC | PRN
  Administered 2015-01-01: 50 mg via INTRAVENOUS

## 2015-01-01 MED ORDER — MEPERIDINE HCL 25 MG/ML IJ SOLN: 6.2500 mg | INTRAMUSCULAR | Status: DC | PRN

## 2015-01-01 MED ORDER — DEXAMETHASONE SODIUM PHOSPHATE 4 MG/ML IJ SOLN
INTRAMUSCULAR | Status: AC
  Filled 2015-01-01: qty 1

## 2015-01-01 MED ORDER — SCOPOLAMINE 1 MG/3DAYS TD PT72
MEDICATED_PATCH | TRANSDERMAL | Status: AC
  Filled 2015-01-01: qty 1

## 2015-01-01 MED ORDER — DIPHENHYDRAMINE HCL 50 MG/ML IJ SOLN
25.0000 mg | Freq: Once | INTRAMUSCULAR | Status: AC
Start: 2015-01-01 — End: 2015-01-01
  Administered 2015-01-01: 25 mg via INTRAVENOUS

## 2015-01-01 MED ORDER — SCOPOLAMINE 1 MG/3DAYS TD PT72
1.0000 | MEDICATED_PATCH | Freq: Once | TRANSDERMAL | Status: DC
  Administered 2015-01-01: 1.5 mg via TRANSDERMAL

## 2015-01-01 MED ORDER — METOCLOPRAMIDE HCL 5 MG/ML IJ SOLN
INTRAMUSCULAR | Status: AC
  Administered 2015-01-01: 10 mg via INTRAVENOUS
  Filled 2015-01-01: qty 2

## 2015-01-01 MED ORDER — ROCURONIUM BROMIDE 100 MG/10ML IV SOLN
INTRAVENOUS | Status: AC
  Filled 2015-01-01: qty 1

## 2015-01-01 MED ORDER — SODIUM CHLORIDE 0.9 % IJ SOLN
INTRAMUSCULAR | Status: AC
  Filled 2015-01-01: qty 10

## 2015-01-01 MED ORDER — GLYCOPYRROLATE 0.2 MG/ML IJ SOLN
INTRAMUSCULAR | Status: AC
  Filled 2015-01-01: qty 2

## 2015-01-01 MED ORDER — FENTANYL CITRATE (PF) 100 MCG/2ML IJ SOLN
25.0000 ug | INTRAMUSCULAR | Status: DC | PRN
  Administered 2015-01-01 (×2): 25 ug via INTRAVENOUS

## 2015-01-01 MED ORDER — PROPOFOL 10 MG/ML IV BOLUS
INTRAVENOUS | Status: DC | PRN
  Administered 2015-01-01: 150 mg via INTRAVENOUS
  Administered 2015-01-01: 50 mg via INTRAVENOUS

## 2015-01-01 MED ORDER — MIDAZOLAM HCL 2 MG/2ML IJ SOLN
INTRAMUSCULAR | Status: AC
  Filled 2015-01-01: qty 4

## 2015-01-01 MED ORDER — ROCURONIUM BROMIDE 100 MG/10ML IV SOLN
INTRAVENOUS | Status: DC | PRN
  Administered 2015-01-01: 35 mg via INTRAVENOUS

## 2015-01-01 MED ORDER — KETOROLAC TROMETHAMINE 30 MG/ML IJ SOLN
INTRAMUSCULAR | Status: DC | PRN
  Administered 2015-01-01: 30 mg via INTRAVENOUS

## 2015-01-01 MED ORDER — DIPHENHYDRAMINE HCL 50 MG/ML IJ SOLN
INTRAMUSCULAR | Status: AC
  Administered 2015-01-01: 25 mg via INTRAVENOUS
  Filled 2015-01-01: qty 1

## 2015-01-01 MED ORDER — LIDOCAINE HCL (CARDIAC) 20 MG/ML IV SOLN
INTRAVENOUS | Status: AC
  Filled 2015-01-01: qty 5

## 2015-01-01 MED ORDER — HYDROCODONE-ACETAMINOPHEN 5-300 MG PO TABS: 1.0000 | ORAL_TABLET | ORAL | Status: DC | PRN

## 2015-01-01 MED ORDER — FENTANYL CITRATE (PF) 100 MCG/2ML IJ SOLN
INTRAMUSCULAR | Status: DC | PRN
  Administered 2015-01-01: 100 ug via INTRAVENOUS
  Administered 2015-01-01: 50 ug via INTRAVENOUS

## 2015-01-01 MED ORDER — CHLOROPROCAINE HCL 1 % IJ SOLN
INTRAMUSCULAR | Status: DC | PRN
Start: 2015-01-01 — End: 2015-01-01
  Administered 2015-01-01: 10 mL

## 2015-01-01 MED ORDER — NEOSTIGMINE METHYLSULFATE 10 MG/10ML IV SOLN
INTRAVENOUS | Status: AC
  Filled 2015-01-01: qty 1

## 2015-01-01 MED ORDER — CHLOROPROCAINE HCL 1 % IJ SOLN
INTRAMUSCULAR | Status: AC
  Filled 2015-01-01: qty 30

## 2015-01-01 MED ORDER — GLYCOPYRROLATE 0.2 MG/ML IJ SOLN
INTRAMUSCULAR | Status: DC | PRN
  Administered 2015-01-01: 0.2 mg via INTRAVENOUS
  Administered 2015-01-01: 0.4 mg via INTRAVENOUS

## 2015-01-01 MED ORDER — ONDANSETRON HCL 4 MG/2ML IJ SOLN
INTRAMUSCULAR | Status: DC | PRN
  Administered 2015-01-01: 4 mg via INTRAVENOUS

## 2015-01-01 MED ORDER — BUPIVACAINE HCL (PF) 0.25 % IJ SOLN
INTRAMUSCULAR | Status: DC | PRN
Start: 2015-01-01 — End: 2015-01-01
  Administered 2015-01-01: 6 mL

## 2015-01-01 MED ORDER — BUPIVACAINE HCL (PF) 0.25 % IJ SOLN
INTRAMUSCULAR | Status: AC
  Filled 2015-01-01: qty 30

## 2015-01-01 MED ORDER — ONDANSETRON HCL 4 MG/2ML IJ SOLN
INTRAMUSCULAR | Status: AC
  Filled 2015-01-01: qty 2

## 2015-01-01 SURGICAL SUPPLY — 24 items
ABLATOR ENDOMETRIAL BIPOLAR (ABLATOR) ×5 IMPLANT
CATH ROBINSON RED A/P 16FR (CATHETERS) ×5 IMPLANT
CLOTH BEACON ORANGE TIMEOUT ST (SAFETY) ×5 IMPLANT
CONTAINER PREFILL 10% NBF 60ML (FORM) ×5 IMPLANT
DRSG COVADERM PLUS 2X2 (GAUZE/BANDAGES/DRESSINGS) IMPLANT
DRSG OPSITE POSTOP 3X4 (GAUZE/BANDAGES/DRESSINGS) ×5 IMPLANT
DURAPREP 26ML APPLICATOR (WOUND CARE) ×5 IMPLANT
GLOVE BIOGEL PI IND STRL 7.0 (GLOVE) ×6 IMPLANT
GLOVE BIOGEL PI INDICATOR 7.0 (GLOVE) ×4
GLOVE ECLIPSE 6.5 STRL STRAW (GLOVE) ×20 IMPLANT
GOWN STRL REUS W/TWL LRG LVL3 (GOWN DISPOSABLE) ×10 IMPLANT
NEEDLE INSUFFLATION 120MM (ENDOMECHANICALS) ×5 IMPLANT
PACK LAPAROSCOPY BASIN (CUSTOM PROCEDURE TRAY) ×5 IMPLANT
PACK VAGINAL MINOR WOMEN LF (CUSTOM PROCEDURE TRAY) ×5 IMPLANT
PAD OB MATERNITY 4.3X12.25 (PERSONAL CARE ITEMS) ×5 IMPLANT
PAD POSITIONING PINK XL (MISCELLANEOUS) ×5 IMPLANT
SUT VICRYL 0 UR6 27IN ABS (SUTURE) ×5 IMPLANT
SUT VICRYL 4-0 PS2 18IN ABS (SUTURE) ×5 IMPLANT
TOWEL OR 17X24 6PK STRL BLUE (TOWEL DISPOSABLE) ×10 IMPLANT
TROCAR OPTI TIP 5M 100M (ENDOMECHANICALS) ×5 IMPLANT
TROCAR XCEL DIL TIP R 11M (ENDOMECHANICALS) ×5 IMPLANT
TUBING AQUILEX INFLOW (TUBING) ×5 IMPLANT
TUBING AQUILEX OUTFLOW (TUBING) ×5 IMPLANT
WATER STERILE IRR 1000ML POUR (IV SOLUTION) ×5 IMPLANT

## 2015-01-01 NOTE — Discharge Instructions (Signed)
DISCHARGE INSTRUCTIONS: HYSTEROSCOPY / ENDOMETRIAL ABLATION The following instructions have been prepared to help you care for yourself upon your return home.  May Remove Scop patch on or before 01/03/2015  May take Ibuprofen after 3:30 pm as needed for pain  May take stool softner while taking narcotic pain medication to prevent constipation.  Drink plenty of water.  Personal hygiene:  Use sanitary pads for vaginal drainage, not tampons.  Shower the day after your procedure.  NO tub baths, pools or Jacuzzis for 2-3 weeks.  Wipe front to back after using the bathroom.  Activity and limitations:  Do NOT drive or operate any equipment for 24 hours. The effects of anesthesia are still present and drowsiness may result.  Do NOT rest in bed all day.  Walking is encouraged.  Walk up and down stairs slowly.  You may resume your normal activity in one to two days or as indicated by your physician.  Sexual activity: NO intercourse for at least 2 weeks after the procedure, or as indicated by your Doctor.  Diet: Eat a light meal as desired this evening. You may resume your usual diet tomorrow.  Return to Work: You may resume your work activities in one to two days or as indicated by Marine scientist.  What to expect after your surgery: Expect to have vaginal bleeding/discharge for 2-3 days and spotting for up to 10 days. It is not unusual to have soreness for up to 1-2 weeks. You may have a slight burning sensation when you urinate for the first day. Mild cramps may continue for a couple of days. You may have a regular period in 2-6 weeks.  Call your doctor for any of the following:  Excessive vaginal bleeding or clotting, saturating and changing one pad every hour.  Inability to urinate 6 hours after discharge from hospital.  Pain not relieved by pain medication.  Fever of 100.4 F or greater.  Unusual vaginal discharge or odor.  DISCHARGE INSTRUCTIONS: Laparoscopy  The  following instructions have been prepared to help you care for yourself upon your return home today.  Wound care:  Do not get the incision wet for the first 24 hours. The incision should be kept clean and dry.  The Band-Aids or dressings may be removed the day after surgery.  Should the incision become sore, red, and swollen after the first week, check with your doctor.  Personal hygiene:  Shower the day after your procedure.  Activity and limitations:  Do NOT drive or operate any equipment today.  Do NOT lift anything more than 15 pounds for 2-3 weeks after surgery.  Do NOT rest in bed all day.  Walking is encouraged. Walk each day, starting slowly with 5-minute walks 3 or 4 times a day. Slowly increase the length of your walks.  Walk up and down stairs slowly.  Do NOT do strenuous activities, such as golfing, playing tennis, bowling, running, biking, weight lifting, gardening, mowing, or vacuuming for 2-4 weeks. Ask your doctor when it is okay to start.  Diet: Eat a light meal as desired this evening. You may resume your usual diet tomorrow.  Return to work: This is dependent on the type of work you do. For the most part you can return to a desk job within a week of surgery. If you are more active at work, please discuss this with your doctor.  What to expect after your surgery: You may have a slight burning sensation when you urinate on the first  day. You may have a very small amount of blood in the urine. Expect to have a small amount of vaginal discharge/light bleeding for 1-2 weeks. It is not unusual to have abdominal soreness and bruising for up to 2 weeks. You may be tired and need more rest for about 1 week. You may experience shoulder pain for 24-72 hours. Lying flat in bed may relieve it.  Call your doctor for any of the following:  Develop a fever of 100.4 or greater  Inability to urinate 6 hours after discharge from hospital  Severe pain not relieved by pain  medications  Persistent of heavy bleeding at incision site  Redness or swelling around incision site after a week  Increasing nausea or vomiting

## 2015-01-01 NOTE — Anesthesia Procedure Notes (Addendum)
Procedure Name: Intubation Date/Time: 01/01/2015 9:03 AM Performed by: Jonna Munro Pre-anesthesia Checklist: Patient identified, Emergency Drugs available, Suction available, Patient being monitored and Timeout performed Patient Re-evaluated:Patient Re-evaluated prior to inductionOxygen Delivery Method: Circle system utilized Preoxygenation: Pre-oxygenation with 100% oxygen Intubation Type: IV induction Laryngoscope Size: Mac and 3 Grade View: Grade II Tube type: Oral Tube size: 7.0 mm Number of attempts: 2 Airway Equipment and Method: Video-laryngoscopy Placement Confirmation: ETT inserted through vocal cords under direct vision,  positive ETCO2 and breath sounds checked- equal and bilateral Secured at: 21 cm Dental Injury: Teeth and Oropharynx as per pre-operative assessment  Difficulty Due To: Difficult Airway- due to immobile epiglottis    Date/Time: 01/01/2015 9:03 AM Performed by: Jonna Munro Comments: DLx 1 with MAC 3, immobile epiglottis, DLx1 with glidescope, good view of cords, easy intubation with glidescope MAC 3 blade, +ETCO2, bilat equal breath sounds. Small amount of blood noted on glidescope blade.

## 2015-01-01 NOTE — H&P (Signed)
Marcia Kirk is an 40 y.o. female. G2P2 WF presents for surgical management of menorrhagia and permanent sterilization. Pt failed mirena IUD and did not tolerate OCP. Hx pelvic endometriosis. Pt here for LTL with cautery, novasure endometrial ablation  Pertinent Gynecological History: Menses: heavy Bleeding: menorrhagia Contraception: none DES exposure: denies Blood transfusions: none Sexually transmitted diseases: no past history Previous GYN Procedures: dx laparoscopy, D&C, hysteroscopy  Last mammogram: normal Date: 2016 Last pap: normal Date: 2016 OB History: G2, P2   Menstrual History: Menarche age: n/a No LMP recorded.    Past Medical History  Diagnosis Date  . Hypothyroidism   . Heart murmur     very mild per pt  . Anxiety   . Headache(784.0)     migraines  . PONV (postoperative nausea and vomiting)     Past Surgical History  Procedure Laterality Date  . Tonsillectomy    . Diagnostic laparoscopy    . Cholecystectomy  2010  . Hysteroscopy w/d&c  01/04/2012    Procedure: DILATATION AND CURETTAGE /HYSTEROSCOPY;  Surgeon: Marvene Staff, MD;  Location: [facility];  Service: Gynecology;  Laterality: N/A;  . Laparoscopy  01/04/2012    Procedure: LAPAROSCOPY DIAGNOSTIC;  Surgeon: Marvene Staff, MD;  Location: [facility];  Service: Gynecology;  Laterality: N/A;  . Mm bone density dex axial/skel ([facility] hx)      AXIAL LIFT WITH RODS AND SCREWS.Marland Kitchen3/11/16    History reviewed. No pertinent family history.  Social History:  reports that she has never smoked. She does not have any smokeless tobacco history on file. She reports that she drinks alcohol. She reports that she does not use illicit drugs.  Allergies:  Allergies  Allergen Reactions  . Codeine Itching and Nausea Only  . Naproxen Itching and Nausea Only    Can take ibuprofen    Prescriptions prior to admission  Medication Sig Dispense Refill Last Dose  . acetaminophen (TYLENOL) 500 MG tablet Take 500 mg by  mouth every 6 (six) hours as needed for moderate pain.     . cholecalciferol (VITAMIN D) 1000 UNITS tablet Take 2,000 Units by mouth daily.     . diphenhydrAMINE (BENADRYL) 25 MG tablet Take 50 mg by mouth every 6 (six) hours as needed for itching.     . fluticasone (FLONASE) 50 MCG/ACT nasal spray Place 2 sprays into both nostrils daily as needed for allergies or rhinitis.     . hyoscyamine (ANASPAZ) 0.125 MG TBDP disintergrating tablet Place 0.125 mg under the tongue every 4 (four) hours as needed for cramping.     Marland Kitchen ibuprofen (ADVIL,MOTRIN) 200 MG tablet Take 800 mg by mouth every 6 (six) hours as needed for moderate pain.     . pseudoephedrine (SUDAFED) 120 MG 12 hr tablet Take 120 mg by mouth daily as needed for congestion.     Marland Kitchen tiZANidine (ZANAFLEX) 4 MG tablet Take 2 mg by mouth every 6 (six) hours as needed for muscle spasms.     . cetirizine (ZYRTEC) 10 MG tablet Take 10 mg by mouth at bedtime.    Past Week at Unknown  . levothyroxine (SYNTHROID, LEVOTHROID) 75 MCG tablet Take 75 mcg by mouth daily.   01/04/2012 at Unknown  . LORazepam (ATIVAN) 1 MG tablet Take 1 mg by mouth at bedtime as needed for sleep. Sleep   Past Week at Unknown  . ondansetron (ZOFRAN) 8 MG tablet Take 8 mg by mouth daily as needed for nausea. Only with migraines about once/month  Past Week at Unknown  . Pediatric Multivitamins-Iron (FLINTSTONES PLUS IRON PO) Take 2 tablets by mouth daily.   More than a month at Unknown  . rizatriptan (MAXALT) 10 MG tablet Take 10 mg by mouth as needed for migraine. May repeat in 2 hours if needed for migraines   Past Week at Unknown    Review of Systems  All other systems reviewed and are negative.   There were no vitals taken for this visit. Physical Exam  Constitutional: She is oriented to person, place, and time. She appears well-developed and well-nourished.  HENT:  Head: Atraumatic.  Eyes: EOM are normal.  Neck: Neck supple.  Respiratory: Effort normal and breath  sounds normal.  GI: Soft. Bowel sounds are normal.  Genitourinary: Vagina normal and uterus normal.  Neurological: She is alert and oriented to person, place, and time.  Skin: Skin is warm and dry.  Psychiatric: She has a normal mood and affect.  adnexal no palp mass  Cervix parous    Assessment/Plan: Desires sterilization Menorrhagia P) LTL with cautery, novasure endometrial ablation. Procedures explained. Risk of surgery reviewed including infection, bleeding, injury to underlying organ structures, failure rate 1/500-1/600, nonreversible, permanent sterilization, 90% reduction in flow. All ? answered  Dayvin Aber A 01/01/2015, 7:46 AM

## 2015-01-01 NOTE — Brief Op Note (Signed)
01/01/2015  10:23 AM  PATIENT:  Marcia Kirk  39 y.o. female  PRE-OPERATIVE DIAGNOSIS:  Menorrhagia, Desires Sterilization  POST-OPERATIVE DIAGNOSIS:  Menorrhagia, Desires Sterilization, IUD removal, stage 1 pelvic endometriosis  PROCEDURE:  IUD removal, diagnostic hysteroscopy, D&C, novasure endometrial ablation, laparoscopic tubal ligation With cautery, fulguration of pelvic endometriosis  SURGEON:  Surgeon(s) and Role:    * Servando Salina, MD - Primary  PHYSICIAN ASSISTANT:   ASSISTANTS: none   ANESTHESIA:   general  EBL:  Total I/O In: 1700 [I.V.:1700] Out: 55 [Urine:50; Blood:5]  BLOOD ADMINISTERED:none  DRAINS: none   LOCAL MEDICATIONS USED:  MARCAINE     SPECIMEN:  Source of Specimen:  emc  DISPOSITION OF SPECIMEN:  PATHOLOGY  COUNTS:  YES  TOURNIQUET:  * No tourniquets in log *  DICTATION: .Other Dictation: Dictation Number [id number]  PLAN OF CARE: Discharge to home after PACU  PATIENT DISPOSITION:  PACU - hemodynamically stable.   Delay start of Pharmacological VTE agent (>24hrs) due to surgical blood loss or risk of bleeding: no

## 2015-01-01 NOTE — Anesthesia Postprocedure Evaluation (Signed)
  Anesthesia Post-op Note  Patient: Marcia Kirk  Procedure(s) Performed: Procedure(s) with comments: DILATATION & CURETTAGE/HYSTEROSCOPY WITH NOVASURE ABLATION (N/A) - IUD removal LAPAROSCOPIC TUBAL LIGATION With Cautery (Bilateral) INTRAUTERINE DEVICE (IUD) REMOVAL  Patient Location: PACU  Anesthesia Type:General  Level of Consciousness: awake, alert  and oriented  Airway and Oxygen Therapy: Patient Spontanous Breathing  Post-op Pain: none  Post-op Assessment: Post-op Vital signs reviewed, Patient's Cardiovascular Status Stable, Respiratory Function Stable, Patent Airway, No signs of Nausea or vomiting and Pain level controlled              Post-op Vital Signs: Reviewed and stable  Last Vitals:  Filed Vitals:   01/01/15 1045  BP: 109/52  Pulse: 56  Temp:   Resp: 14    Complications: No apparent anesthesia complications

## 2015-01-01 NOTE — Transfer of Care (Signed)
Immediate Anesthesia Transfer of Care Note  Patient: Marcia Kirk  Procedure(s) Performed: Procedure(s) with comments: DILATATION & CURETTAGE/HYSTEROSCOPY WITH NOVASURE ABLATION (N/A) - IUD removal LAPAROSCOPIC TUBAL LIGATION With Cautery (Bilateral) INTRAUTERINE DEVICE (IUD) REMOVAL  Patient Location: PACU  Anesthesia Type:General  Level of Consciousness: awake, alert  and oriented  Airway & Oxygen Therapy: Patient Spontanous Breathing and Patient connected to nasal cannula oxygen  Post-op Assessment: Report given to RN and Post -op Vital signs reviewed and stable  Post vital signs: Reviewed and stable  Last Vitals:  Filed Vitals:   01/01/15 0758  BP: 112/66  Pulse: 75  Temp: 36.9 C  Resp: 18    Complications: No apparent anesthesia complications

## 2015-01-02 NOTE — Op Note (Signed)
Marcia Kirk, BEVEL                  ACCOUNT NO.:  [id number]  MEDICAL RECORD NO.:  [id number]  LOCATION:  [facility]                          FACILITY:  [facility]  PHYSICIAN:  Servando Salina, M.D.DATE OF BIRTH:  [birth year]  DATE OF PROCEDURE:  01/01/2015 DATE OF DISCHARGE:  01/01/2015                              OPERATIVE REPORT   PREOPERATIVE DIAGNOSES:  Menorrhagia, desires sterilization.  PROCEDURE:  Diagnostic hysteroscopy, NovaSure endometrial ablation, IUD removal, laparoscopic tubal ligation with cautery, fulguration of stage I pelvic endometriosis.  POSTOPERATIVE DIAGNOSIS:  Menorrhagia, stage I pelvic endometriosis, desires sterilization, IUD removal.  ANESTHESIA:  General.  SURGEON:  Servando Salina, MD  ASSISTANT:  None.  DESCRIPTION OF PROCEDURE:  Under adequate general anesthesia, the patient was placed in dorsal lithotomy position.  She was sterilely prepped and draped in usual fashion.  The patient was catheterized for moderate amount of urine.  Examination under anesthesia reveals anteverted uterus.  No adnexal masses could be appreciated.  A bivalve speculum was placed in the vagina.  IUD was removed.  Single-tooth tenaculum was placed on the anterior lip of the cervix.  Cervix was then serially dilated.  A diagnostic hysteroscope was introduced into the uterine cavity.  Endometrial thickening was noted in focal areas throughout.  Both tubal ostia could be seen.  The hysteroscope was removed.  The cavity was curetted for scant amount of tissue.  The uterus sounded to 10 cm.  The endocervical canal was 4 cm giving uterine length of 6 cm.  The NovaSure endometrial ablation apparatus was then inserted.  A width of 3.5, was achieved.  Ablation was performed over 68 seconds with a power of 99.  After the procedure was done, the apparatus was removed.  The diagnostic hysteroscope was reinserted.  Good ablation throughout was noted.  At that point, an Acorn cannula was  introduced into the cervical os and attached tenaculum for manipulation of the uterus.  The bivalve speculum was removed.  Attention was then turned to the abdomen.  A 0.25% Marcaine was injected along the previous infraumbilical.  Incision was then made.  Veress needle was introduced and tested.  3 L of CO2 was insufflated.  Veress needle was then removed.  A 10 mm disposable trocar with sleeve was introduced into the abdomen without incident.  A lighted video laparoscope was then inserted confirmed entry into the abdomen without incident.  Panoramic inspection was then done.  Normal liver edge was noted.  The uterus was then manipulated.  Endometriotic implant was noted on the peritoneal surface lateral to the left fallopian tube and in the posterior cul-de-sac at the junction of the uterosacral ligament in it's midline with pocket area of endometriosis.  Both tubes were noted.  Both ovaries were noted to be normal.  The uterus was otherwise unremarkable.  A 2nd incision was then made suprapubically and a 5 mm port was placed under direct visualization.  The pelvis was further inspected with a probe. Subsequently, a bipolar cautery was then introduced through the midportion of both fallopian tubes and cauterized.  Subsequently, the cautery was then used to cauterize the endometriotic implants that was seen.  The procedure was then  terminated by removing the suprapubic site under direct visualization.  The infraumbilical site was also brought out taking care not to bring up any underlying structures and the abdomen deflated.  The incisions were then closed with 0 Vicryl figure- of-eight suture at the infraumbilical fascial site and incisions were otherwise closed with 4-0 Vicryl subcuticular sutures.  SPECIMENS:  Endometrial curettings sent to Pathology.  ESTIMATED BLOOD LOSS:  About 5 mL.  URINE OUTPUT:  50 mL.  INTRAOPERATIVE FLUIDS:  1700 mL.  COUNTS:  Sponge and instrument  counts x2 was correct.  COMPLICATIONS:  None.  The patient tolerated the procedure well, was transferred to recovery in stable condition.     Servando Salina, M.D.     [place]/MEDQ  D:  01/02/2015  T:  01/02/2015  Job:  [id number]

## 2015-01-04 ENCOUNTER — Encounter (HOSPITAL_COMMUNITY): Payer: Self-pay | Admitting: Obstetrics and Gynecology

## 2015-12-20 ENCOUNTER — Emergency Department (HOSPITAL_COMMUNITY): Payer: Commercial Managed Care - PPO

## 2015-12-20 ENCOUNTER — Encounter (HOSPITAL_COMMUNITY): Payer: Self-pay | Admitting: Emergency Medicine

## 2015-12-20 ENCOUNTER — Emergency Department (HOSPITAL_COMMUNITY)
Admission: EM | Admit: 2015-12-20 | Discharge: 2015-12-20 | Disposition: A | Discharge status: Home / Self Care | Payer: Commercial Managed Care - PPO | Attending: Emergency Medicine | Admitting: Emergency Medicine

## 2015-12-20 DIAGNOSIS — Z79899 Other long term (current) drug therapy: Secondary | ICD-10-CM | POA: Diagnosis not present

## 2015-12-20 DIAGNOSIS — E039 Hypothyroidism, unspecified: Secondary | ICD-10-CM | POA: Diagnosis not present

## 2015-12-20 DIAGNOSIS — M549 Dorsalgia, unspecified: Secondary | ICD-10-CM

## 2015-12-20 HISTORY — DX: Endometriosis, unspecified: N80.9

## 2015-12-20 MED ORDER — DIAZEPAM 5 MG/ML IJ SOLN
5.0000 mg | Freq: Once | INTRAMUSCULAR | Status: AC
Start: 2015-12-20 — End: 2015-12-20
  Administered 2015-12-20: 5 mg via INTRAVENOUS
  Filled 2015-12-20: qty 2

## 2015-12-20 MED ORDER — DIAZEPAM 5 MG PO TABS: 5.0000 mg | ORAL_TABLET | Freq: Two times a day (BID) | ORAL | 0 refills | Status: DC

## 2015-12-20 MED ORDER — ONDANSETRON HCL 4 MG/2ML IJ SOLN
4.0000 mg | Freq: Once | INTRAMUSCULAR | Status: AC
  Administered 2015-12-20: 4 mg via INTRAVENOUS
  Filled 2015-12-20: qty 2

## 2015-12-20 MED ORDER — HYDROMORPHONE HCL 1 MG/ML IJ SOLN
1.0000 mg | Freq: Once | INTRAMUSCULAR | Status: AC
  Administered 2015-12-20: 1 mg via INTRAVENOUS
  Filled 2015-12-20: qty 1

## 2015-12-20 NOTE — ED Provider Notes (Signed)
[facility] DEPT Provider Note   CSN: [id number] Arrival date & time: 12/20/15  0607  First Provider Contact:  First MD Initiated Contact with Patient 12/20/15 0740        History   Chief Complaint Chief Complaint  Patient presents with  . Flank Pain    HPI Marcia Kirk is a 40 y.o. female.  The history is provided by the patient and a significant other.   41 year old female with history of anxiety, endometriosis status post ablation, hypothyroidism, migraine headaches, chronic back pain, presenting to the ED for worsening left lower back pain for the past several days. Patient has had 2 surgeries on her back for decompression with some mild improvement. She reports her surgeon has told her she continues to have residual nerve compression bilaterally. She states recently she has been undergoing steroid injection of her back, but pain has seemed worse after her last injection which was mid-June. She states her doctor started her on prednisone which she has completed without relief. She is also been taking her home oxycodone and Motrin regularly without any change. She reports this is abnormal as her combination of medication usually controls her pain adequately. She states pain begins in her left lower back and radiates into her left hip and into the left thigh but does not descend past the knee. She reports some intermittent numbness of her left foot but that is chronic in nature. She denies any difficulty walking, but states it is painful to do so. She denies any bowel or bladder incontinence. No urinary symptoms. States she was told by her surgeon if these spinal injections did not help, she would need another MRI. This has not yet been scheduled.  Patient reports she feels that her pain is most likely from her back, but was not sure due to her history of endometriosis. She reports she did recently have some vaginal spotting and saw her OB/GYN for this who has scheduled her to start  Lupron. She states that her checkup her OB did not know any noted abnormalities. She states she is not currently sexually active. She denied any pelvic pain or vaginal complaints. No urinary symptoms.  Past Medical History:  Diagnosis Date  . Anxiety   . Endometriosis   . Headache(784.0)    migraines  . Heart murmur    very mild per pt  . Hypothyroidism   . PONV (postoperative nausea and vomiting)     There are no active problems to display for this patient.   Past Surgical History:  Procedure Laterality Date  . BACK SURGERY    . CHOLECYSTECTOMY  2010  . DIAGNOSTIC LAPAROSCOPY    . DILITATION & CURRETTAGE/HYSTROSCOPY WITH NOVASURE ABLATION N/A 01/01/2015   Procedure: DILATATION & CURETTAGE/HYSTEROSCOPY WITH NOVASURE ABLATION;  Surgeon: Servando Salina, MD;  Location: [facility];  Service: Gynecology;  Laterality: N/A;  IUD removal  . HYSTEROSCOPY W/D&C  01/04/2012   Procedure: DILATATION AND CURETTAGE /HYSTEROSCOPY;  Surgeon: Marvene Staff, MD;  Location: [facility];  Service: Gynecology;  Laterality: N/A;  . IUD REMOVAL  01/01/2015   Procedure: INTRAUTERINE DEVICE (IUD) REMOVAL;  Surgeon: Servando Salina, MD;  Location: [facility];  Service: Gynecology;;  . LAPAROSCOPIC TUBAL LIGATION Bilateral 01/01/2015   Procedure: LAPAROSCOPIC TUBAL LIGATION With Cautery;  Surgeon: Servando Salina, MD;  Location: [facility];  Service: Gynecology;  Laterality: Bilateral;  . LAPAROSCOPY  01/04/2012   Procedure: LAPAROSCOPY DIAGNOSTIC;  Surgeon: Marvene Staff, MD;  Location: [facility];  Service: Gynecology;  Laterality: N/A;  . MM BONE DENSITY DEX AXIAL/SKEL ([facility] HX)     AXIAL LIFT WITH RODS AND SCREWS.Marland Kitchen3/11/16  . TONSILLECTOMY      OB History    No data available       Home Medications    Prior to Admission medications   Medication Sig Start Date End Date Taking? Authorizing Provider  acetaminophen (TYLENOL) 500 MG tablet Take 500 mg by mouth every 6 (six) hours as needed for  moderate pain.    Historical Provider, MD  cetirizine (ZYRTEC) 10 MG tablet Take 10 mg by mouth at bedtime.     Historical Provider, MD  cholecalciferol (VITAMIN D) 1000 UNITS tablet Take 2,000 Units by mouth daily.    Historical Provider, MD  diphenhydrAMINE (BENADRYL) 25 MG tablet Take 50 mg by mouth every 6 (six) hours as needed for itching.    Historical Provider, MD  fluticasone (FLONASE) 50 MCG/ACT nasal spray Place 2 sprays into both nostrils daily as needed for allergies or rhinitis.    Historical Provider, MD  Hydrocodone-Acetaminophen (VICODIN) 5-300 MG TABS Take 1 tablet by mouth every 4 (four) hours as needed. 01/01/15   Servando Salina, MD  hyoscyamine (ANASPAZ) 0.125 MG TBDP disintergrating tablet Place 0.125 mg under the tongue every 4 (four) hours as needed for cramping.    Historical Provider, MD  ibuprofen (ADVIL,MOTRIN) 200 MG tablet Take 800 mg by mouth every 6 (six) hours as needed for moderate pain.    Historical Provider, MD  levothyroxine (SYNTHROID, LEVOTHROID) 75 MCG tablet Take 75 mcg by mouth daily.    Historical Provider, MD  LORazepam (ATIVAN) 1 MG tablet Take 1 mg by mouth at bedtime as needed for sleep. Sleep    Historical Provider, MD  ondansetron (ZOFRAN) 8 MG tablet Take 8 mg by mouth daily as needed for nausea. Only with migraines about once/month    Historical Provider, MD  Pediatric Multivitamins-Iron (FLINTSTONES PLUS IRON PO) Take 2 tablets by mouth daily.    Historical Provider, MD  pseudoephedrine (SUDAFED) 120 MG 12 hr tablet Take 120 mg by mouth daily as needed for congestion.    Historical Provider, MD  rizatriptan (MAXALT) 10 MG tablet Take 10 mg by mouth as needed for migraine. May repeat in 2 hours if needed for migraines    Historical Provider, MD  tiZANidine (ZANAFLEX) 4 MG tablet Take 2 mg by mouth every 6 (six) hours as needed for muscle spasms.    Historical Provider, MD    Family History Family History  Problem Relation Age of Onset  .  Hypertension Mother   . Cancer Mother   . Hypertension Father   . Diabetes Father     Social History Social History  Substance Use Topics  . Smoking status: Never Smoker  . Smokeless tobacco: Never Used  . Alcohol use Yes     Comment: rare     Allergies   Aleve [naproxen sodium]; Codeine; and Naproxen   Review of Systems Review of Systems  Musculoskeletal: Positive for back pain.  All other systems reviewed and are negative.    Physical Exam Updated Vital Signs BP 121/86 (BP Location: Left Arm)   Pulse 79   Temp 98.4 F (36.9 C) (Oral)   Resp 20   Ht 5\' 5"  (1.651 m)   Wt 84.4 kg   SpO2 98%   BMI 30.95 kg/m   Physical Exam  Constitutional: She is oriented to person, place, and time. She appears well-developed and well-nourished.  HENT:  Head: Normocephalic and atraumatic.  Mouth/Throat: Oropharynx is clear and moist.  Eyes: Conjunctivae and EOM are normal. Pupils are equal, round, and reactive to light.  Neck: Normal range of motion.  Cardiovascular: Normal rate, regular rhythm and normal heart sounds.   Pulmonary/Chest: Effort normal and breath sounds normal.  Abdominal: Soft. Bowel sounds are normal. There is no tenderness. There is no rigidity and no guarding.  Abdomen soft, benign, no tenderness or guarding, normal bowel sounds  Musculoskeletal: Normal range of motion.  2 paraspinal lumbar incisions which are well-healed without any signs of infection Tenderness of the left SI joint with endorsed radiation to the left hip and left thigh, normal strength and sensation of both legs, no foot drop, mildly decreased sensation of left foot (baseline); normal sensation in right foot  Neurological: She is alert and oriented to person, place, and time.  Skin: Skin is warm and dry.  Psychiatric: She has a normal mood and affect.  Nursing note and vitals reviewed.    ED Treatments / Results  Labs (all labs ordered are listed, but only abnormal results are  displayed) Labs Reviewed - No data to display  EKG  EKG Interpretation None       Radiology Mr Lumbar Spine Wo Contrast  Result Date: 12/20/2015 CLINICAL DATA:  Severe sharp pain in the left side radiating to the leg since Saturday. EXAM: MRI LUMBAR SPINE WITHOUT CONTRAST TECHNIQUE: Multiplanar, multisequence MR imaging of the lumbar spine was performed. No intravenous contrast was administered. COMPARISON:  None currently available FINDINGS: Segmentation:  Standard. Alignment:  Slight anterolisthesis at L5-S1, status post fusion. Vertebrae: No fracture, evidence of discitis, or bone lesion. Bilateral L5 pars defect. Conus medullaris: Extends to the L1-2 disc level and appears normal. Paraspinal and other soft tissues: 14 mm cyst with mild T1 hyperintensity and fluid fluid level in the right uterine fundus. Tiny presumed cyst seen in the right kidney and right liver. Rounded 2 cm partly seen isointense masslike finding contiguous with the upper pole left kidney. Disc levels: T12- L1: Unremarkable. L1-L2: Unremarkable. L2-L3: Unremarkable. L3-L4: Mild disc narrowing and right eccentric bulging. Negative facets. No impingement L4-L5: Small central disc protrusion superimposed on narrowed and desiccated disc. Mild facet arthropathy. No impingement L5-S1:Posterior-lateral fusion with rod and pedicle screws. Intervertebral screw. On sagittal reformats there is effacement of right foraminal fat concerning for herniation of residual disc, with L5 impingement. The canal and left foramen are patent. IMPRESSION: 1. No left-sided impingement to explain left leg symptoms. 2. Postoperative L5-S1 with right foraminal fat effacement which could be disc or scar. Per outside report from 2016 graft material was noted in this region. Contrast would be useful in differentiating scar from disc if clinically needed. 3. Mild L3-4 and L4-5 disc degeneration without impingement. 4. Suspected 2 cm left renal or adrenal mass, but  only seen on 1 slice. Enhanced CT or MRI may be needed, but would first confirm presence with sonography given patient's young age. 5. 14 mm uterine cyst with fluid level. This could be trapped endometrial fluid or sub endometrial cyst. Sonography could visualize the adjacent endometrium. Electronically Signed   By: Monte Fantasia M.D.   On: 12/20/2015 11:21   Procedures Procedures (including critical care time)  Medications Ordered in ED Medications - No data to display   Initial Impression / Assessment and Plan / ED Course  I have reviewed the triage vital signs and the nursing notes.  Pertinent labs & imaging results that  were available during my care of the patient were reviewed by me and considered in my medical decision making (see chart for details).  Clinical Course   40 year old female here with increased back pain. This seems sciatic in nature. She sees neurosurgery at [facility] and has been receiving steroid injections in her spine. She is afebrile and nontoxic. She has no new neurologic deficits noted today. Tenderness is of the left SI joint with some endorsed radiation to her left thigh. She has baseline decreased sensation in her left foot which is unchanged. She has normal sensation otherwise and normal strength. There is no foot drop. Given her worsening pain with recent steroid injections, will obtain MRI to rule out any acute process. Patient given dose of Dilaudid and Valium here.  MRI results reviewed-- post-surgical changes noted.  Incidental findings of renal and liver cysts with questionable left renal/adrenal mass but only seen on one image.  Fluid noted in uterus along with cyst.  Patient has known endometriosis, followed by OB/GYN for these issues.  She has some vaginal bleeding currently which may account for the fluid in her uterus. She has an upcoming appointment with her OB-GYN for follow-up as well. Renal cyst versus mass likely incidental  findings, do not feel this is necessarily what is causing her pain today, nonetheless she will need outpatient CT for further evaluation of this..  Feel her symptoms today more likely acute exacerbation of her chronic lumbar radiculopathy.  Results discussed with patient, she acknowledged understanding.  Patient's pain has been controlled well here in the ED. She remains ambulatory without difficulty.  No noted neurologic deficits here.  She did get some relief with Valium, will add this to her home regimen. She will follow-up with her neurosurgeon. We'll also follow-up with her PCP to have outpatient imaging done.  She has OB-GYN follow up that has been previously arranged.  Discussed plan with patient, she acknowledged understanding and agreed with plan of care.  Return precautions given for new or worsening symptoms.  Case discussed with attending physician, Dr. Venora Maples, who reviewed MRI results and agrees with assessment and plan of care.  Final Clinical Impressions(s) / ED Diagnoses   Final diagnoses:  Back pain  Back pain, unspecified location    New Prescriptions Discharge Medication List as of 12/20/2015 12:34 PM    START taking these medications   Details  diazepam (VALIUM) 5 MG tablet Take 1 tablet (5 mg total) by mouth 2 (two) times daily., Starting Mon 12/20/2015, Print         Larene Pickett, PA-C 12/20/15 [place], MD 12/20/15 [phone]

## 2015-12-20 NOTE — ED Notes (Signed)
Patient transported to MRI 

## 2015-12-20 NOTE — ED Triage Notes (Signed)
Pt states since Saturday she has been having severe sharp pain in her left side that goes down her left leg  Pt states she has had back problems and has a fusion of L5-S1 and also has a hx of endometriosis  Pt denies injury  Pt states sometimes with her menstrual cycles she gets bad pain  Pt states she is unable to determine if this is from her back, cycle, or endometriosis  Pt states she uses Oxy 5mg  for her back pain and she has been using that along with ibuprofen and tylenol  Pt states the pain will ease and then return when the medication starts to wear off  Pt states she has also been on prednisone as well   Pt does state that she was seen by her OB/GYN a week or two ago and everything was okay  Pt states she is going to start Lupron this Thursday

## 2015-12-20 NOTE — Discharge Instructions (Signed)
As we discussed, you will need follow-up with your primary care doctor and likely outpatient CT scan for further evaluation of renal cysts. Also recommend to follow-up with your OB-GYN. May take valium with your regular pain medications. Follow-up with your neurosurgery regarding your back.  See copy of MRI results. Return here for any new concerns.

## 2015-12-20 NOTE — ED Notes (Signed)
Informed PA of patient's complaint

## 2015-12-22 ENCOUNTER — Other Ambulatory Visit: Payer: Self-pay | Admitting: Obstetrics and Gynecology

## 2016-03-31 NOTE — Progress Notes (Signed)
Pt is being scheduled for preop appt; please place surgical orders in epic. Thanks.  

## 2016-04-25 NOTE — Patient Instructions (Addendum)
DESRAE ISING  04/25/2016   Your procedure is scheduled on: 05-03-16  Report to [facility]  Entrance take [facility]  elevators to 3rd floor to  [place] at 630  AM.  Call this number if you have problems the morning of surgery [phone]   Remember: ONLY 1 PERSON MAY GO WITH YOU TO SHORT STAY TO GET  READY MORNING OF [place].  Do not eat food :After Midnight, Monday night clear liquids all day Tuesday per dr Tresa Moore follow all bowel prep instructions from dr Tresa Moore.      Take these medicines the morning of surgery with A SIP OF WATER:  Flonase if needed, Levothyroxine, Ranitidine (Zantac) if needed, Xanax if needed, Oxycodone if needed.               You may not have any metal on your body including hair pins and              piercings  Do not wear jewelry, make-up, lotions, powders or perfumes, deodorant             Do not wear nail polish.  Do not shave  48 hours prior to surgery.              Men may shave face and neck.   Do not bring valuables to the hospital. [place].  Contacts, dentures or bridgework may not be worn into surgery.  Leave suitcase in the car. After surgery it may be brought to your room.               Please read over the following fact sheets you were given: _____________________________________________________________________                CLEAR LIQUID DIET   Foods Allowed                                                                     Foods Excluded  Coffee and tea, regular and decaf                             liquids that you cannot  Plain Jell-O in any flavor                                             see through such as: Fruit ices (not with fruit pulp)                                     milk, soups, orange juice  Iced Popsicles                                    All solid food Carbonated beverages, regular and diet  Cranberry, grape and apple juices Sports drinks like Gatorade Lightly seasoned clear broth or consume(fat free) Sugar, honey syrup  Sample Menu Breakfast                                Lunch                                     Supper Cranberry juice                    Beef broth                            Chicken broth Jell-O                                     Grape juice                           Apple juice Coffee or tea                        Jell-O                                      Popsicle                                                Coffee or tea                        Coffee or tea  _____________________________________________________________________  [facility] - Preparing for Surgery Before surgery, you can play an important role.  Because skin is not sterile, your skin needs to be as free of germs as possible.  You can reduce the number of germs on your skin by washing with CHG (chlorahexidine gluconate) soap before surgery.  CHG is an antiseptic cleaner which kills germs and bonds with the skin to continue killing germs even after washing. Please DO NOT use if you have an allergy to CHG or antibacterial soaps.  If your skin becomes reddened/irritated stop using the CHG and inform your nurse when you arrive at Short Stay. Do not shave (including legs and underarms) for at least 48 hours prior to the first CHG shower.  You may shave your face/neck. Please follow these instructions carefully:  1.  Shower with CHG Soap the night before surgery and the  morning of Surgery.  2.  If you choose to wash your hair, wash your hair first as usual with your  normal  shampoo.  3.  After you shampoo, rinse your hair and body thoroughly to remove the  shampoo.                           4.  Use CHG as you would any other liquid soap.  You can apply chg directly  to the skin and wash  Gently with a scrungie or clean washcloth.  5.  Apply the CHG Soap to your body ONLY  FROM THE NECK DOWN.   Do not use on face/ open                           Wound or open sores. Avoid contact with eyes, ears mouth and genitals (private parts).                       Wash face,  Genitals (private parts) with your normal soap.             6.  Wash thoroughly, paying special attention to the area where your surgery  will be performed.  7.  Thoroughly rinse your body with warm water from the neck down.  8.  DO NOT shower/wash with your normal soap after using and rinsing off  the CHG Soap.                9.  Pat yourself dry with a clean towel.            10.  Wear clean pajamas.            11.  Place clean sheets on your bed the night of your first shower and do not  sleep with pets. Day of Surgery : Do not apply any lotions/deodorants the morning of surgery.  Please wear clean clothes to the hospital/surgery center.  FAILURE TO FOLLOW THESE INSTRUCTIONS MAY RESULT IN THE CANCELLATION OF YOUR SURGERY PATIENT SIGNATURE_________________________________  NURSE SIGNATURE__________________________________  ________________________________________________________________________

## 2016-04-25 NOTE — Progress Notes (Signed)
Pt has pre op 04-28-16 please place orders in epic asap thanks

## 2016-04-27 ENCOUNTER — Other Ambulatory Visit: Payer: Self-pay | Admitting: Urology

## 2016-04-27 NOTE — Progress Notes (Signed)
NEED SURGICAL ORDERS PLEASE  THANKS

## 2016-04-28 ENCOUNTER — Encounter (HOSPITAL_COMMUNITY)
Admission: RE | Admit: 2016-04-28 | Discharge: 2016-04-28 | Disposition: A | Discharge status: Home / Self Care | Payer: Commercial Managed Care - PPO | Source: Ambulatory Visit | Attending: Urology | Admitting: Urology

## 2016-04-28 ENCOUNTER — Encounter (HOSPITAL_COMMUNITY): Payer: Self-pay

## 2016-04-28 DIAGNOSIS — C642 Malignant neoplasm of left kidney, except renal pelvis: Secondary | ICD-10-CM | POA: Diagnosis not present

## 2016-04-28 DIAGNOSIS — Z01812 Encounter for preprocedural laboratory examination: Secondary | ICD-10-CM | POA: Diagnosis not present

## 2016-04-28 HISTORY — DX: Pneumonia, unspecified organism: J18.9

## 2016-04-28 HISTORY — DX: Pain in unspecified joint: M25.50

## 2016-04-28 HISTORY — DX: Retention of urine, unspecified: R33.9

## 2016-04-28 HISTORY — DX: Family history of other specified conditions: Z84.89

## 2016-04-28 LAB — BASIC METABOLIC PANEL
Anion gap: 6 (ref 5–15)
BUN: 7 mg/dL (ref 6–20)
CO2: 28 mmol/L (ref 22–32)
CREATININE: 0.65 mg/dL (ref 0.44–1.00)
Calcium: 9 mg/dL (ref 8.9–10.3)
Chloride: 106 mmol/L (ref 101–111)
GFR calc Af Amer: >60 mL/min (ref >60)
GLUCOSE: 88 mg/dL (ref 65–99)
POTASSIUM: 3.9 mmol/L (ref 3.5–5.1)
SODIUM: 140 mmol/L (ref 135–145)

## 2016-04-28 LAB — CBC
HEMATOCRIT: 38 % (ref 36.0–46.0)
Hemoglobin: 12.3 g/dL (ref 12.0–15.0)
MCH: 30.8 pg (ref 26.0–34.0)
MCHC: 32.4 g/dL (ref 30.0–36.0)
MCV: 95.2 fL (ref 78.0–100.0)
PLATELETS: 264 10*3/uL (ref 150–400)
RBC: 3.99 MIL/uL (ref 3.87–5.11)
RDW: 13.3 % (ref 11.5–15.5)
WBC: 5.1 10*3/uL (ref 4.0–10.5)

## 2016-04-28 LAB — HCG, SERUM, QUALITATIVE: Preg, Serum: NEGATIVE

## 2016-04-28 LAB — ABO/RH: ABO/RH(D): A NEG

## 2016-04-28 NOTE — Progress Notes (Signed)
Pt has pre-op visit this morning at 8:00AM.  Need pre-op orders. Thank you.

## 2016-05-02 ENCOUNTER — Encounter (HOSPITAL_COMMUNITY): Payer: Self-pay | Admitting: Anesthesiology

## 2016-05-02 NOTE — Anesthesia Preprocedure Evaluation (Addendum)
Anesthesia Evaluation  Patient identified by MRN, date of birth, ID band Patient awake    Reviewed: Allergy & Precautions, NPO status , Patient's Chart, lab work & pertinent test results  History of Anesthesia Complications (+) PONV, Family history of anesthesia reaction and history of anesthetic complications  Airway Mallampati: II  TM Distance: >3 FB Neck ROM: Full    Dental no notable dental hx. (+) Teeth Intact   Pulmonary pneumonia, resolved,    Pulmonary exam normal breath sounds clear to auscultation       Cardiovascular negative cardio ROS Normal cardiovascular exam+ Valvular Problems/Murmurs  Rhythm:Regular Rate:Normal     Neuro/Psych  Headaches, Anxiety    GI/Hepatic negative GI ROS, Neg liver ROS,   Endo/Other  Hypothyroidism Obesity  Renal/GU Left renal mass  negative genitourinary   Musculoskeletal negative musculoskeletal ROS (+)   Abdominal (+) + obese,   Peds  Hematology   Anesthesia Other Findings   Reproductive/Obstetrics                            Anesthesia Physical Anesthesia Plan  ASA: II  Anesthesia Plan: General   Post-op Pain Management:    Induction: Intravenous  Airway Management Planned: Oral ETT  Additional Equipment:   Intra-op Plan:   Post-operative Plan: Extubation in OR  Informed Consent: I have reviewed the patients History and Physical, chart, labs and discussed the procedure including the risks, benefits and alternatives for the proposed anesthesia with the patient or authorized representative who has indicated his/her understanding and acceptance.   Dental advisory given  Plan Discussed with: Anesthesiologist, CRNA and Surgeon  Anesthesia Plan Comments:         Anesthesia Quick Evaluation

## 2016-05-03 ENCOUNTER — Encounter (HOSPITAL_COMMUNITY)
Admission: RE | Disposition: A | Discharge status: Home / Self Care | Payer: Self-pay | Source: Ambulatory Visit | Attending: Urology

## 2016-05-03 ENCOUNTER — Inpatient Hospital Stay (HOSPITAL_COMMUNITY)
Admission: RE | Admit: 2016-05-03 | Discharge: 2016-05-05 | DRG: 658 | Disposition: A | Discharge status: Home / Self Care | Payer: Commercial Managed Care - PPO | Source: Ambulatory Visit | Attending: Urology | Admitting: Urology

## 2016-05-03 ENCOUNTER — Inpatient Hospital Stay (HOSPITAL_COMMUNITY): Payer: Commercial Managed Care - PPO | Admitting: Anesthesiology

## 2016-05-03 ENCOUNTER — Encounter (HOSPITAL_COMMUNITY): Payer: Self-pay | Admitting: Certified Registered Nurse Anesthetist

## 2016-05-03 DIAGNOSIS — Z888 Allergy status to other drugs, medicaments and biological substances status: Secondary | ICD-10-CM

## 2016-05-03 DIAGNOSIS — M549 Dorsalgia, unspecified: Secondary | ICD-10-CM | POA: Diagnosis present

## 2016-05-03 DIAGNOSIS — F419 Anxiety disorder, unspecified: Secondary | ICD-10-CM | POA: Diagnosis present

## 2016-05-03 DIAGNOSIS — Z886 Allergy status to analgesic agent status: Secondary | ICD-10-CM | POA: Diagnosis not present

## 2016-05-03 DIAGNOSIS — Z8249 Family history of ischemic heart disease and other diseases of the circulatory system: Secondary | ICD-10-CM

## 2016-05-03 DIAGNOSIS — C642 Malignant neoplasm of left kidney, except renal pelvis: Principal | ICD-10-CM | POA: Diagnosis present

## 2016-05-03 DIAGNOSIS — G8929 Other chronic pain: Secondary | ICD-10-CM | POA: Diagnosis present

## 2016-05-03 DIAGNOSIS — N2889 Other specified disorders of kidney and ureter: Secondary | ICD-10-CM | POA: Diagnosis present

## 2016-05-03 HISTORY — PX: ROBOTIC ASSITED PARTIAL NEPHRECTOMY: SHX6087

## 2016-05-03 LAB — TYPE AND SCREEN
ABO/RH(D): A NEG
Antibody Screen: NEGATIVE

## 2016-05-03 LAB — HEMOGLOBIN AND HEMATOCRIT, BLOOD
HCT: 37.1 % (ref 36.0–46.0)
HEMOGLOBIN: 12.4 g/dL (ref 12.0–15.0)

## 2016-05-03 SURGERY — NEPHRECTOMY, PARTIAL, ROBOT-ASSISTED
Anesthesia: General | Laterality: Left

## 2016-05-03 MED ORDER — FENTANYL CITRATE (PF) 250 MCG/5ML IJ SOLN
INTRAMUSCULAR | Status: AC
  Filled 2016-05-03: qty 5

## 2016-05-03 MED ORDER — HYDROMORPHONE HCL 2 MG/ML IJ SOLN
INTRAMUSCULAR | Status: AC
  Filled 2016-05-03: qty 1

## 2016-05-03 MED ORDER — ALPRAZOLAM 0.5 MG PO TABS
0.5000 mg | ORAL_TABLET | Freq: Two times a day (BID) | ORAL | Status: DC | PRN
  Administered 2016-05-03 – 2016-05-04 (×3): 0.5 mg via ORAL
  Filled 2016-05-03 (×3): qty 1

## 2016-05-03 MED ORDER — HYDROMORPHONE HCL 1 MG/ML IJ SOLN
0.2500 mg | INTRAMUSCULAR | Status: AC | PRN
  Administered 2016-05-03 (×3): 0.5 mg via INTRAVENOUS
  Administered 2016-05-03: 0.25 mg via INTRAVENOUS
  Administered 2016-05-03 (×2): 0.5 mg via INTRAVENOUS
  Administered 2016-05-03: 0.25 mg via INTRAVENOUS
  Administered 2016-05-03 (×2): 0.5 mg via INTRAVENOUS

## 2016-05-03 MED ORDER — SUGAMMADEX SODIUM 500 MG/5ML IV SOLN
INTRAVENOUS | Status: DC | PRN
  Administered 2016-05-03: 200 mg via INTRAVENOUS

## 2016-05-03 MED ORDER — BUPIVACAINE LIPOSOME 1.3 % IJ SUSP
INTRAMUSCULAR | Status: DC | PRN
  Administered 2016-05-03: 20 mL

## 2016-05-03 MED ORDER — MIDAZOLAM HCL 5 MG/5ML IJ SOLN
INTRAMUSCULAR | Status: DC | PRN
  Administered 2016-05-03: 1 mg via INTRAVENOUS
  Administered 2016-05-03 (×2): 0.5 mg via INTRAVENOUS

## 2016-05-03 MED ORDER — METOCLOPRAMIDE HCL 5 MG/ML IJ SOLN
INTRAMUSCULAR | Status: DC | PRN
  Administered 2016-05-03: 5 mg via INTRAVENOUS

## 2016-05-03 MED ORDER — OXYCODONE HCL 5 MG PO TABS: 5.0000 mg | ORAL_TABLET | Freq: Four times a day (QID) | ORAL | 0 refills | Status: DC | PRN

## 2016-05-03 MED ORDER — SODIUM CHLORIDE 0.9 % IR SOLN
Status: DC | PRN
  Administered 2016-05-03: 1000 mL

## 2016-05-03 MED ORDER — MIDAZOLAM HCL 2 MG/2ML IJ SOLN
INTRAMUSCULAR | Status: AC
  Filled 2016-05-03: qty 2

## 2016-05-03 MED ORDER — ACETAMINOPHEN 500 MG PO TABS
1000.0000 mg | ORAL_TABLET | Freq: Four times a day (QID) | ORAL | Status: AC
  Administered 2016-05-03 – 2016-05-04 (×4): 1000 mg via ORAL
  Filled 2016-05-03 (×4): qty 2

## 2016-05-03 MED ORDER — FENTANYL CITRATE (PF) 100 MCG/2ML IJ SOLN
INTRAMUSCULAR | Status: DC | PRN
  Administered 2016-05-03 (×9): 50 ug via INTRAVENOUS

## 2016-05-03 MED ORDER — DIPHENHYDRAMINE HCL 50 MG/ML IJ SOLN
INTRAMUSCULAR | Status: DC | PRN
  Administered 2016-05-03: 25 mg via INTRAVENOUS

## 2016-05-03 MED ORDER — SUCCINYLCHOLINE CHLORIDE 20 MG/ML IJ SOLN
INTRAMUSCULAR | Status: DC | PRN
  Administered 2016-05-03: 120 mg via INTRAVENOUS

## 2016-05-03 MED ORDER — FAMOTIDINE 20 MG PO TABS
20.0000 mg | ORAL_TABLET | Freq: Two times a day (BID) | ORAL | Status: DC
  Administered 2016-05-03 – 2016-05-04 (×3): 20 mg via ORAL
  Filled 2016-05-03 (×4): qty 1

## 2016-05-03 MED ORDER — CEFAZOLIN SODIUM-DEXTROSE 2-4 GM/100ML-% IV SOLN
INTRAVENOUS | Status: AC
  Filled 2016-05-03: qty 100

## 2016-05-03 MED ORDER — CEFAZOLIN SODIUM-DEXTROSE 2-4 GM/100ML-% IV SOLN
2.0000 g | INTRAVENOUS | Status: AC
  Administered 2016-05-03: 2 g via INTRAVENOUS
  Filled 2016-05-03: qty 100

## 2016-05-03 MED ORDER — PROPOFOL 10 MG/ML IV BOLUS
INTRAVENOUS | Status: DC | PRN
  Administered 2016-05-03: 175 mg via INTRAVENOUS
  Administered 2016-05-03: 25 mg via INTRAVENOUS

## 2016-05-03 MED ORDER — ONDANSETRON HCL 4 MG/2ML IJ SOLN
INTRAMUSCULAR | Status: DC | PRN
  Administered 2016-05-03: 4 mg via INTRAVENOUS

## 2016-05-03 MED ORDER — SUGAMMADEX SODIUM 200 MG/2ML IV SOLN
INTRAVENOUS | Status: AC
  Filled 2016-05-03: qty 2

## 2016-05-03 MED ORDER — NORETHINDRONE ACETATE 5 MG PO TABS
5.0000 mg | ORAL_TABLET | Freq: Every day | ORAL | Status: DC
  Administered 2016-05-03 – 2016-05-04 (×2): 5 mg via ORAL
  Filled 2016-05-03 (×2): qty 1

## 2016-05-03 MED ORDER — BUPIVACAINE LIPOSOME 1.3 % IJ SUSP
INTRAMUSCULAR | Status: AC
  Filled 2016-05-03: qty 20

## 2016-05-03 MED ORDER — DIPHENHYDRAMINE HCL 12.5 MG/5ML PO ELIX: 12.5000 mg | ORAL_SOLUTION | Freq: Four times a day (QID) | ORAL | Status: DC | PRN

## 2016-05-03 MED ORDER — ONDANSETRON HCL 4 MG/2ML IJ SOLN
INTRAMUSCULAR | Status: AC
  Filled 2016-05-03: qty 2

## 2016-05-03 MED ORDER — METHOCARBAMOL 1000 MG/10ML IJ SOLN
500.0000 mg | Freq: Once | INTRAVENOUS | Status: AC
  Administered 2016-05-03: 500 mg via INTRAVENOUS
  Filled 2016-05-03: qty 550

## 2016-05-03 MED ORDER — ACETAMINOPHEN 10 MG/ML IV SOLN
1000.0000 mg | Freq: Once | INTRAVENOUS | Status: AC
  Administered 2016-05-03: 1000 mg via INTRAVENOUS

## 2016-05-03 MED ORDER — METOCLOPRAMIDE HCL 5 MG/ML IJ SOLN
INTRAMUSCULAR | Status: AC
  Filled 2016-05-03: qty 2

## 2016-05-03 MED ORDER — SODIUM CHLORIDE 0.9 % IJ SOLN
INTRAMUSCULAR | Status: AC
  Filled 2016-05-03: qty 50

## 2016-05-03 MED ORDER — LACTATED RINGERS IV SOLN
INTRAVENOUS | Status: DC | PRN
  Administered 2016-05-03 (×2): via INTRAVENOUS

## 2016-05-03 MED ORDER — DIPHENHYDRAMINE HCL 50 MG/ML IJ SOLN: 12.5000 mg | Freq: Four times a day (QID) | INTRAMUSCULAR | Status: DC | PRN

## 2016-05-03 MED ORDER — MEPERIDINE HCL 50 MG/ML IJ SOLN: 6.2500 mg | INTRAMUSCULAR | Status: DC | PRN

## 2016-05-03 MED ORDER — ROCURONIUM BROMIDE 50 MG/5ML IV SOSY
PREFILLED_SYRINGE | INTRAVENOUS | Status: AC
  Filled 2016-05-03: qty 5

## 2016-05-03 MED ORDER — OXYCODONE HCL 5 MG PO TABS
10.0000 mg | ORAL_TABLET | Freq: Four times a day (QID) | ORAL | Status: DC | PRN
  Administered 2016-05-05: 10 mg via ORAL
  Filled 2016-05-03: qty 2

## 2016-05-03 MED ORDER — ONDANSETRON HCL 4 MG/2ML IJ SOLN
4.0000 mg | Freq: Once | INTRAMUSCULAR | Status: AC
  Administered 2016-05-03: 4 mg via INTRAVENOUS

## 2016-05-03 MED ORDER — PROPOFOL 10 MG/ML IV BOLUS
INTRAVENOUS | Status: AC
  Filled 2016-05-03: qty 20

## 2016-05-03 MED ORDER — DEXAMETHASONE SODIUM PHOSPHATE 10 MG/ML IJ SOLN
INTRAMUSCULAR | Status: DC | PRN
  Administered 2016-05-03: 10 mg via INTRAVENOUS

## 2016-05-03 MED ORDER — SCOPOLAMINE 1 MG/3DAYS TD PT72
MEDICATED_PATCH | TRANSDERMAL | Status: DC | PRN
  Administered 2016-05-03: 1 via TRANSDERMAL

## 2016-05-03 MED ORDER — ROCURONIUM BROMIDE 100 MG/10ML IV SOLN
INTRAVENOUS | Status: DC | PRN
  Administered 2016-05-03: 40 mg via INTRAVENOUS
  Administered 2016-05-03 (×2): 10 mg via INTRAVENOUS

## 2016-05-03 MED ORDER — SCOPOLAMINE 1 MG/3DAYS TD PT72
MEDICATED_PATCH | TRANSDERMAL | Status: AC
  Filled 2016-05-03: qty 1

## 2016-05-03 MED ORDER — LEVOTHYROXINE SODIUM 75 MCG PO TABS
75.0000 ug | ORAL_TABLET | Freq: Every day | ORAL | Status: DC
  Administered 2016-05-04 – 2016-05-05 (×2): 75 ug via ORAL
  Filled 2016-05-03 (×2): qty 1

## 2016-05-03 MED ORDER — ACETAMINOPHEN 10 MG/ML IV SOLN
INTRAVENOUS | Status: AC
  Filled 2016-05-03: qty 100

## 2016-05-03 MED ORDER — ONDANSETRON HCL 4 MG/2ML IJ SOLN
4.0000 mg | INTRAMUSCULAR | Status: DC | PRN
  Administered 2016-05-03 – 2016-05-04 (×2): 4 mg via INTRAVENOUS
  Filled 2016-05-03 (×3): qty 2

## 2016-05-03 MED ORDER — DEXTROSE-NACL 5-0.45 % IV SOLN
INTRAVENOUS | Status: DC
  Administered 2016-05-04: 01:00:00 via INTRAVENOUS

## 2016-05-03 MED ORDER — LIDOCAINE 2% (20 MG/ML) 5 ML SYRINGE
INTRAMUSCULAR | Status: AC
  Filled 2016-05-03: qty 5

## 2016-05-03 MED ORDER — TIZANIDINE HCL 4 MG PO TABS
2.0000 mg | ORAL_TABLET | Freq: Three times a day (TID) | ORAL | Status: DC | PRN
  Administered 2016-05-04: 2 mg via ORAL
  Filled 2016-05-03: qty 1

## 2016-05-03 MED ORDER — DEXAMETHASONE SODIUM PHOSPHATE 10 MG/ML IJ SOLN
INTRAMUSCULAR | Status: AC
  Filled 2016-05-03: qty 1

## 2016-05-03 MED ORDER — HYDROMORPHONE HCL 2 MG/ML IJ SOLN
0.2500 mg | INTRAMUSCULAR | Status: DC | PRN
  Administered 2016-05-03 (×4): 0.5 mg via INTRAVENOUS

## 2016-05-03 MED ORDER — HYDROMORPHONE HCL 2 MG/ML IJ SOLN
1.0000 mg | INTRAMUSCULAR | Status: DC | PRN
  Administered 2016-05-03 – 2016-05-05 (×9): 1 mg via INTRAVENOUS
  Filled 2016-05-03 (×9): qty 1

## 2016-05-03 MED ORDER — MANNITOL 25 % IV SOLN
INTRAVENOUS | Status: AC
  Filled 2016-05-03: qty 100

## 2016-05-03 MED ORDER — STERILE WATER FOR IRRIGATION IR SOLN
Status: DC | PRN
  Administered 2016-05-03: 1000 mL

## 2016-05-03 MED ORDER — METOCLOPRAMIDE HCL 5 MG/ML IJ SOLN
10.0000 mg | Freq: Once | INTRAMUSCULAR | Status: AC | PRN
  Administered 2016-05-03: 10 mg via INTRAVENOUS

## 2016-05-03 MED ORDER — LIDOCAINE HCL (CARDIAC) 20 MG/ML IV SOLN
INTRAVENOUS | Status: DC | PRN
  Administered 2016-05-03: 80 mg via INTRAVENOUS

## 2016-05-03 MED ORDER — MAGNESIUM CITRATE PO SOLN: 1.0000 | Freq: Once | ORAL | Status: DC

## 2016-05-03 SURGICAL SUPPLY — 66 items
APPLICATOR SURGIFLO ENDO (HEMOSTASIS) ×3 IMPLANT
CHLORAPREP W/TINT 26ML (MISCELLANEOUS) ×3 IMPLANT
CLIP LIGATING HEM O LOK PURPLE (MISCELLANEOUS) ×3 IMPLANT
CLIP LIGATING HEMO LOK XL GOLD (MISCELLANEOUS) IMPLANT
CLIP LIGATING HEMO O LOK GREEN (MISCELLANEOUS) ×6 IMPLANT
CLIP SUT LAPRA TY ABSORB (SUTURE) ×6 IMPLANT
COVER SURGICAL LIGHT HANDLE (MISCELLANEOUS) ×3 IMPLANT
COVER TIP SHEARS 8 DVNC (MISCELLANEOUS) ×1 IMPLANT
COVER TIP SHEARS 8MM DA VINCI (MISCELLANEOUS) ×2
DECANTER SPIKE VIAL GLASS SM (MISCELLANEOUS) IMPLANT
DERMABOND ADVANCED (GAUZE/BANDAGES/DRESSINGS)
DERMABOND ADVANCED .7 DNX12 (GAUZE/BANDAGES/DRESSINGS) IMPLANT
DRAIN CHANNEL 15F RND FF 3/16 (WOUND CARE) ×3 IMPLANT
DRAPE ARM DVNC X/XI (DISPOSABLE) ×4 IMPLANT
DRAPE COLUMN DVNC XI (DISPOSABLE) ×1 IMPLANT
DRAPE DA VINCI XI ARM (DISPOSABLE) ×8
DRAPE DA VINCI XI COLUMN (DISPOSABLE) ×2
DRAPE INCISE IOBAN 66X45 STRL (DRAPES) ×3 IMPLANT
DRAPE LAPAROSCOPIC ABDOMINAL (DRAPES) ×3 IMPLANT
DRAPE SHEET LG 3/4 BI-LAMINATE (DRAPES) ×3 IMPLANT
ELECT PENCIL ROCKER SW 15FT (MISCELLANEOUS) ×3 IMPLANT
ELECT REM PT RETURN 9FT ADLT (ELECTROSURGICAL) ×3
ELECTRODE REM PT RTRN 9FT ADLT (ELECTROSURGICAL) ×1 IMPLANT
EVACUATOR SILICONE 100CC (DRAIN) ×3 IMPLANT
GLOVE BIO SURGEON STRL SZ 6.5 (GLOVE) ×2 IMPLANT
GLOVE BIO SURGEONS STRL SZ 6.5 (GLOVE) ×1
GLOVE BIOGEL M STRL SZ7.5 (GLOVE) ×6 IMPLANT
GOWN STRL REUS W/TWL LRG LVL3 (GOWN DISPOSABLE) ×6 IMPLANT
HEMOSTAT SURGICEL 4X8 (HEMOSTASIS) ×3 IMPLANT
IRRIG SUCT STRYKERFLOW 2 WTIP (MISCELLANEOUS)
IRRIGATION SUCT STRKRFLW 2 WTP (MISCELLANEOUS) IMPLANT
KIT BASIN OR (CUSTOM PROCEDURE TRAY) ×3 IMPLANT
LOOP VESSEL MAXI BLUE (MISCELLANEOUS) ×3 IMPLANT
MARKER SKIN DUAL TIP RULER LAB (MISCELLANEOUS) ×3 IMPLANT
NEEDLE INSUFFLATION 14GA 120MM (NEEDLE) ×3 IMPLANT
NS IRRIG 1000ML POUR BTL (IV SOLUTION) IMPLANT
PORT ACCESS TROCAR AIRSEAL 12 (TROCAR) ×1 IMPLANT
PORT ACCESS TROCAR AIRSEAL 5M (TROCAR) ×2
POSITIONER SURGICAL ARM (MISCELLANEOUS) ×6 IMPLANT
POUCH SPECIMEN RETRIEVAL 10MM (ENDOMECHANICALS) ×3 IMPLANT
RELOAD STAPLER WHITE 60MM (STAPLE) IMPLANT
SET TRI-LUMEN FLTR TB AIRSEAL (TUBING) ×3 IMPLANT
SOLUTION ELECTROLUBE (MISCELLANEOUS) ×3 IMPLANT
SPONGE LAP 4X18 X RAY DECT (DISPOSABLE) ×3 IMPLANT
STAPLE ECHEON FLEX 60 POW ENDO (STAPLE) IMPLANT
STAPLER RELOAD WHITE 60MM (STAPLE)
SURGIFLO W/THROMBIN 8M KIT (HEMOSTASIS) ×3 IMPLANT
SUT ETHILON 3 0 PS 1 (SUTURE) ×3 IMPLANT
SUT MNCRL AB 4-0 PS2 18 (SUTURE) ×6 IMPLANT
SUT PDS AB 1 CT1 27 (SUTURE) ×6 IMPLANT
SUT V-LOC BARB 180 2/0GR6 GS22 (SUTURE)
SUT VIC AB 0 CT1 27 (SUTURE) ×8
SUT VIC AB 0 CT1 27XBRD ANTBC (SUTURE) ×4 IMPLANT
SUT VIC AB 2-0 SH 27 (SUTURE) ×4
SUT VIC AB 2-0 SH 27X BRD (SUTURE) ×2 IMPLANT
SUT VLOC BARB 180 ABS3/0GR12 (SUTURE) ×3
SUTURE V-LC BRB 180 2/0GR6GS22 (SUTURE) IMPLANT
SUTURE VLOC BRB 180 ABS3/0GR12 (SUTURE) ×1 IMPLANT
TAPE STRIPS DRAPE STRL (GAUZE/BANDAGES/DRESSINGS) IMPLANT
TOWEL OR 17X26 10 PK STRL BLUE (TOWEL DISPOSABLE) ×6 IMPLANT
TOWEL OR NON WOVEN STRL DISP B (DISPOSABLE) ×3 IMPLANT
TRAY FOLEY W/METER SILVER 16FR (SET/KITS/TRAYS/PACK) ×3 IMPLANT
TRAY LAPAROSCOPIC (CUSTOM PROCEDURE TRAY) ×3 IMPLANT
TROCAR BLADELESS OPT 5 100 (ENDOMECHANICALS) IMPLANT
TROCAR XCEL 12X100 BLDLESS (ENDOMECHANICALS) ×3 IMPLANT
WATER STERILE IRR 1500ML POUR (IV SOLUTION) IMPLANT

## 2016-05-03 NOTE — H&P (Signed)
Marcia Kirk is an 40 y.o. female.    Chief Complaint: Pre-op LEFT robotic partial nephrectomy  HPI:   1 - Left Renal Mass - 2cm left extreme upper pole bosniak 4 enhancing cystic mass incidental on spine MRI and confirmed on dedicated renal MRI 12/2015. No additional renal masses. 1 artery / 1 vein left renovascular anatomy, very prominent left gonadal vein.   PMH sig for chronic back pain (surgery x2, chronic narcotics with pain contract through neurologist), anxiety, BTL/endometrial ablation, lap chole. No CV disease / blood thinners. Her PCP is Jonathon Jordan MD.   Today "Marcia Kirk" is seen to proceed with LEFT robotic partial nephrectomy.    Past Medical History:  Diagnosis Date  . Anxiety   . Endometriosis   . Family history of adverse reaction to anesthesia    post op n/v  . Headache(784.0)    migraines  . Heart murmur    very mild per pt  . Hypothyroidism   . Joint pain   . Pneumonia    40 years old  . PONV (postoperative nausea and vomiting)   . Urinary retention    after anesthesia    Past Surgical History:  Procedure Laterality Date  . BACK SURGERY     L5-S1 fusion  . CHOLECYSTECTOMY  2010  . DIAGNOSTIC LAPAROSCOPY    . DILITATION & CURRETTAGE/HYSTROSCOPY WITH NOVASURE ABLATION N/A 01/01/2015   Procedure: DILATATION & CURETTAGE/HYSTEROSCOPY WITH NOVASURE ABLATION;  Surgeon: Servando Salina, MD;  Location: [place] [facility];  Service: Gynecology;  Laterality: N/A;  IUD removal  . HYSTEROSCOPY W/D&C  01/04/2012   Procedure: DILATATION AND CURETTAGE /HYSTEROSCOPY;  Surgeon: Marvene Staff, MD;  Location: [place] [facility];  Service: Gynecology;  Laterality: N/A;  . IUD REMOVAL  01/01/2015   Procedure: INTRAUTERINE DEVICE (IUD) REMOVAL;  Surgeon: Servando Salina, MD;  Location: [place] [facility];  Service: Gynecology;;  . LAPAROSCOPIC TUBAL LIGATION Bilateral 01/01/2015   Procedure: LAPAROSCOPIC TUBAL LIGATION With Cautery;  Surgeon: Servando Salina, MD;  Location: [place] [facility];  Service:  Gynecology;  Laterality: Bilateral;  . LAPAROSCOPY  01/04/2012   Procedure: LAPAROSCOPY DIAGNOSTIC;  Surgeon: Marvene Staff, MD;  Location: [place] [facility];  Service: Gynecology;  Laterality: N/A;  . MM BONE DENSITY DEX AXIAL/SKEL ([facility] HX)     AXIAL LIFT WITH RODS AND SCREWS.Marland Kitchen3/11/16  . TONSILLECTOMY    . TUBAL LIGATION      Family History  Problem Relation Age of Onset  . Hypertension Mother   . Cancer Mother   . Hypertension Father   . Diabetes Father    Social History:  reports that she has never smoked. She has never used smokeless tobacco. She reports that she drinks alcohol. She reports that she does not use drugs.  Allergies:  Allergies  Allergen Reactions  . Aleve [Naproxen Sodium]   . Naproxen Itching and Nausea Only    Can take ibuprofen  . Other Hives    Derma Bond    No prescriptions prior to admission.    No results found for this or any previous visit (from the past 48 hour(s)). No results found.  Review of Systems  Constitutional: Negative.  Negative for chills and fever.  HENT: Negative.   Eyes: Negative.   Respiratory: Negative.   Cardiovascular: Negative.   Gastrointestinal: Negative.   Genitourinary: Negative.  Negative for hematuria.  Musculoskeletal: Positive for back pain.  Skin: Negative.   Neurological: Negative.   Psychiatric/Behavioral: Negative.     Last menstrual period 12/28/2015. Physical Exam  HENT:  Head: Normocephalic.  Eyes: Pupils are equal, round, and reactive to light.  Neck: Normal range of motion.  Cardiovascular: Normal rate.   Respiratory: Effort normal.  GI: Soft.  Genitourinary:  Genitourinary Comments: No CVAT  Musculoskeletal: Normal range of motion.  Neurological: She is alert.  Skin: Skin is warm.  Psychiatric: She has a normal mood and affect. Her behavior is normal.     Assessment/Plan  1 - Left Renal Mass - proceed as planned with LEFT robotic partial nephretomy today. Risks, benefits, alternatives,  expected peri-op course discussed previously and reiterated today.   Alexis Frock, MD 05/03/2016, 5:54 AM

## 2016-05-03 NOTE — Anesthesia Postprocedure Evaluation (Signed)
Anesthesia Post Note  Patient: Marcia Kirk  Procedure(s) Performed: Procedure(s) (LRB): XI ROBOTIC ASSITED PARTIAL NEPHRECTOMY (Left)  Patient location during evaluation: PACU Anesthesia Type: General Level of consciousness: awake and alert and oriented Pain management: pain level controlled Vital Signs Assessment: post-procedure vital signs reviewed and stable Respiratory status: spontaneous breathing, nonlabored ventilation, respiratory function stable and patient connected to nasal cannula oxygen Cardiovascular status: blood pressure returned to baseline and stable Postop Assessment: no signs of nausea or vomiting Anesthetic complications: no    Last Vitals:  Vitals:   05/03/16 1245 05/03/16 1300  BP: 121/81 112/79  Pulse: 70 63  Resp: 16 12  Temp:      Last Pain:  Vitals:   05/03/16 1245  TempSrc:   PainSc: 6                  [name] A.

## 2016-05-03 NOTE — Brief Op Note (Signed)
05/03/2016  11:38 AM  PATIENT:  Murray Hodgkins  40 y.o. female  PRE-OPERATIVE DIAGNOSIS:  LEFT RENAL MASS  POST-OPERATIVE DIAGNOSIS:  LEFT RENAL MASS  PROCEDURE:  Procedure(s): XI ROBOTIC ASSITED PARTIAL NEPHRECTOMY (Left)  SURGEON:  Surgeon(s) and Role:    * Alexis Frock, MD - Primary   PHYSICIAN ASSISTANT:   ASSISTANTS: Debbrah Alar, PA   ANESTHESIA:   local and general  EBL:  Total I/O In: 1000 [I.V.:1000] Out: 500 [Urine:400; Blood:100]  BLOOD ADMINISTERED:none  DRAINS: JP to bulb (LLQ), Foley to gravity  LOCAL MEDICATIONS USED:  MARCAINE     SPECIMEN:  Source of Specimen:  Left partial nephrectomy  DISPOSITION OF SPECIMEN:  PATHOLOGY  COUNTS:  YES  TOURNIQUET:  * No tourniquets in log *  DICTATION: .Other Dictation: Dictation Number [id number]  PLAN OF CARE: Admit to inpatient   PATIENT DISPOSITION:  PACU - hemodynamically stable.   Delay start of Pharmacological VTE agent (>24hrs) due to surgical blood loss or risk of bleeding: yes

## 2016-05-03 NOTE — Transfer of Care (Signed)
Immediate Anesthesia Transfer of Care Note  Patient: Marcia Kirk  Procedure(s) Performed: Procedure(s): XI ROBOTIC ASSITED PARTIAL NEPHRECTOMY (Left)  Patient Location: PACU  Anesthesia Type:General  Level of Consciousness: awake, oriented, patient cooperative and responds to stimulation  Airway & Oxygen Therapy: Patient Spontanous Breathing and Patient connected to face mask oxygen  Post-op Assessment: Report given to RN, Post -op Vital signs reviewed and stable and Patient moving all extremities  Post vital signs: Reviewed  Last Vitals:  Vitals:   05/03/16 0633  BP: 124/79  Pulse: 96  Resp: 16  Temp: 37.1 C    Last Pain:  Vitals:   05/03/16 0646  TempSrc:   PainSc: 3          Complications: No apparent anesthesia complications

## 2016-05-03 NOTE — Discharge Instructions (Signed)

## 2016-05-03 NOTE — Anesthesia Procedure Notes (Signed)
Procedure Name: Intubation Date/Time: 05/03/2016 8:55 AM Performed by: Josephine Igo Pre-anesthesia Checklist: Patient identified, Emergency Drugs available, Suction available, Patient being monitored and Timeout performed Patient Re-evaluated:Patient Re-evaluated prior to inductionOxygen Delivery Method: Circle system utilized Preoxygenation: Pre-oxygenation with 100% oxygen Intubation Type: IV induction and Cricoid Pressure applied Ventilation: Mask ventilation without difficulty and Mask ventilation throughout procedure Laryngoscope Size: Glidescope and 3 Grade View: Grade III Tube type: Oral Tube size: 7.5 mm Number of attempts: 2 Airway Equipment and Method: Video-laryngoscopy Placement Confirmation: ETT inserted through vocal cords under direct vision,  positive ETCO2 and breath sounds checked- equal and bilateral Secured at: 21 cm Tube secured with: Tape Dental Injury: Teeth and Oropharynx as per pre-operative assessment  Difficulty Due To: Difficulty was anticipated and Difficult Airway- due to limited oral opening Future Recommendations: Recommend- induction with short-acting agent, and alternative techniques readily available Comments: DL once with mac 3.  Unable to visualize cords with cricoid pressure/head lift.  To glidescope mac 3.  Cords exposed slightly to left.  No apparent damage to oral structures.

## 2016-05-04 LAB — HEMOGLOBIN AND HEMATOCRIT, BLOOD
HEMATOCRIT: 34.8 % — AB (ref 36.0–46.0)
HEMOGLOBIN: 11.4 g/dL — AB (ref 12.0–15.0)

## 2016-05-04 LAB — BASIC METABOLIC PANEL
ANION GAP: 5 (ref 5–15)
BUN: <5 mg/dL — ABNORMAL LOW (ref 6–20)
CO2: 27 mmol/L (ref 22–32)
Calcium: 8.2 mg/dL — ABNORMAL LOW (ref 8.9–10.3)
Chloride: 102 mmol/L (ref 101–111)
Creatinine, Ser: 0.82 mg/dL (ref 0.44–1.00)
Glucose, Bld: 161 mg/dL — ABNORMAL HIGH (ref 65–99)
Potassium: 5.1 mmol/L (ref 3.5–5.1)
SODIUM: 134 mmol/L — AB (ref 135–145)

## 2016-05-04 NOTE — Progress Notes (Signed)
Urology Progress Note  1 Day Post-Op  Subjective: The patient is doing well.  Some nausea overnight, no vomiting. Pain is adequately controlled.  Objective: Vital signs in last 24 hours: Temp:  [97.7 F (36.5 C)-98.6 F (37 C)] 98.5 F (36.9 C) (12/14 0631) Pulse Rate:  [55-107] 59 (12/14 0631) Resp:  [10-22] 16 (12/14 0631) BP: (100-139)/(54-96) 112/69 (12/14 0631) SpO2:  [96 %-100 %] 100 % (12/14 0631)  Intake/Output from previous day: 12/13 0701 - 12/14 0700 In: 2520 [P.O.:120; I.V.:2400] Out: [id number] [Urine:4350; Drains:100; Blood:100] Intake/Output this shift: No intake/output data recorded.  Physical Exam:  General: Alert and oriented. CV: RRR Lungs: Clear bilaterally. GI: Soft, Nondistended. Incisions: Clean, dry, and intact Urine: Clear Extremities: Nontender, no erythema, no edema.  Lab Results:  Recent Labs  05/03/16 1210 05/04/16 0520  HGB 12.4 11.4*  HCT 37.1 34.8*      Assessment/Plan: POD#1 s/p left robotic partial nephrectomy.  1. D/c foley catheter 2. [place], regular diet 3. Ambulate, IS 4. Likely discharge later today versus tomorrow 5. JP drain likely to be removed prior to discharge   LOS: 1 day   LOMBOY, JASON R 05/04/2016, 7:06 AM    I have seen and examined the patient and agree with plan.  Briefly,  S: 1 - Left Renal Mass- s/p left robotic partial nephrecotmy on 05/03/16 the day of admission. Foley removed POD 1. Path pending.  O: NAD, mother at bedside AOx3 Non-labored breathing, RRR Port sites c/d/i. Jp scant serosanguinous discharge. No c/c/e with SCD's in place  hgb and Cr acceptable  A/P: Doing well POD 1. Plan for DC tomorrow AM based on current progress. Goals for DC reitterated.

## 2016-05-04 NOTE — Op Note (Signed)
NAME:  Marcia Kirk, Marcia Kirk                       ACCOUNT NO.:  MEDICAL RECORD NO.:  [id number]  LOCATION:                                 FACILITY:  PHYSICIAN:  Alexis Frock, MD     DATE OF BIRTH:  06-17-1975  DATE OF PROCEDURE: 05/03/2016                              OPERATIVE REPORT   PREOPERATIVE DIAGNOSIS:  Left renal mass.  PROCEDURE: 1. Robotic-assisted laparoscopic left partial nephrectomy. 2. Intraoperative ultrasound interpretation.  ESTIMATED BLOOD LOSS:  100 mL.  COMPLICATION:  None.  SPECIMEN:  Left partial nephrectomy for permanent pathology.  WARM ISCHEMIA TIME:  14 minutes.  ASSISTANT:  Debbrah Alar, PA.  DRAINS: 1. Jackson-Pratt drain to bulb suction. 2. Foley catheter straight drain.  FINDINGS: 1. Single artery, single vein, left renovascular anatomy as     anticipated. 2. Very large left gonadal vein as anticipated. 3. Phenomenally exophytic and heterogeneous left upper pole mass by     direct visualization and intraoperative ultrasound.  INDICATION:  Marcia Kirk is a pleasant 40 year old lady with history of hearing loss and chronic back pain.  She was found incidentally on spine imaging to have an enhancing solid heterogeneous left upper pole renal mass, worrisome for possible localized renal cell carcinoma.  She underwent dedicated renal imaging which corroborated this concerning mass, but no evidence of locally advanced or distant disease.  Options were discussed for management including surveillance protocols versus surgical extirpation with and without nephron sparing versus ablative therapies.  The patient adamantly wished to proceed with left partial nephrectomy.  Informed consent was obtained and placed in the medical record.  PROCEDURE IN DETAIL:  The patient being Marcia Kirk, was verified. Procedure being left robotic partial nephrectomy was confirmed. Procedure was carried out.  Time-out was performed.  Intravenous antibiotics were  administered.  General endotracheal anesthesia introduced.  The patient was placed into a left side up full flank position, applying 15 degrees of stable flexion, superior arm elevator, axillary roll, sequential compression devices, bottom leg bent, top leg straight.  Foley catheter had been placed per urethra to straight drain. Beanbag was deployed.  All bony prominences were very carefully padded. She was further fashioned to the operating table using 3-inch tape over foam padding across her supraxiphoid chest and her pelvis.  Sterile field was then created by prepping and draping the patient's entire left flank and abdomen using chlorhexidine gluconate.  Next, a high-flow, low- pressure pneumoperitoneum was obtained using Veress technique in the left lower quadrant having passed the aspiration and drop test.  Next, an 8-mm robotic camera port was placed in position approximately 1 handbreadth superolateral to the umbilicus.  Laparoscopic examination of the peritoneal cavity revealed no significant adhesions and no visceral injury.  Additional ports were then placed as follows; left subcostal 8- mm robotic port, left far lateral 8-mm robotic port 4 fingerbreadths superomedial to the anterior superior iliac spine, left paramedian inferior robotic port approximately 1-1/2 handbreadth superior to the pubic ramus, and two 12-mm assistant ports in the midline, one in the supraumbilical crease and another one superior to this approximately 2 fingerbreadths above the level of the camera port.  Robot was docked and passed through the electronic checks.  Initial attention was directed at development of retroperitoneum.  Incision was made lateral to the descending colon from the area of the splenic flexure towards the area of the internal ring.  This was carefully mobilized medially.  Lower pole of kidney was identified, placed on gentle lateral traction. Dissection proceeded medial to this.  The  very large left gonadal vein was seen as was the ureter, these were very carefully placed on gentle lateral traction.  Dissection proceeded in this triangle of the gonadal vessels, ureter, psoas musculature towards the area of the renal hilum. Renal hilum consisted of single artery, single vein, left renovascular anatomy as anticipated.  The artery was circumferentially mobilized and marked with vessel loop.  Attention was then directed at identification of the mass.  Given the upper pole location of the mass, lateral splenic attachments were taken down allowing the spleen to rotate medially on gravity self-traction and ureter was very carefully swept away from the posterior surface of the pancreas.  The anterior superior parenchyma was identified and followed towards the area of the most superior aspect of the kidney.  The mass in question was indeed seen grossly, did appear to be predominantly exophytic.  Circumferential mobilization was performed keeping a bucket handle of fat with the area of mass.  Next intraoperative ultrasound was performed with drop-in probe.  Intraoperative ultrasound revealed a heterogeneous left superior upper pole mass that was predominantly exophytic, estimated 80% exophytic or more, that did appear quite solid.    Using a combination of ultrasound visualization and external visual cues, an area of planned partial nephrectomy plane was carefully scored circumferentially around the area of the mass in the kidney.  Warm ischemia was then achieved placing two bulldog clamps on the single artery and partial nephrectomy was performed using cold scissor down, keeping what appeared to be a rim of normal parenchyma with the partial nephrectomy specimen, which occurred to visual satisfaction grossly.  There was minimal collecting system entrance at the very superior aspect of this.  First-layer renorrhaphy was performed using running 3-0 V-Loc suture, over-sewing 1  very small entrance into the collecting system as well as several small venous sinuses.  The second-layer renorrhaphy was performed using interrupted Vicryl sandwiched between Hem-O-Lok and lapper ties.  The bulldog clamps were released for total warm ischemia time of 14 minutes.  Hemostasis of the partial nephrectomy appeared to be excellent.  A bolster was placed in the area overlying this and retroperitoneal fat and Gerota's reapproximated over this using running Vicryl.  The specimen was placed into an EndoCatch bag for later retrieval.  A closed suction drain was brought to the previous lateral most robotic port site near the peritoneal cavity.  Robot was then undocked.  The superior most assistant port site was closed at the level of the fascia using Minette Headland suture passer under laparoscopic vision.  The specimen was then removed via the inferior most assistant port site after dilating the fascia to accommodate 2 surgeon's fingers.  This fascial defect was closed using figure-of-eight PDS x2 followed by reapproximation of Scarpa's with running Vicryl.  All incision sites were infiltrated with dilute lyophilized Marcaine and closed level of skin using subcuticular Monocryl followed by Steri-Strips.  Drain stitch was applied, procedure terminated.  The patient tolerated the procedure well.  There were no immediate periprocedural complications.  The patient was taken to postanesthesia care unit in stable condition.  Please note, surgical assistant,  Debbrah Alar, was absolutely crucial for all robotic portions of this procedure.  She provided invaluable traction, suctioning, placement, and repositioning of robotic instruments as well as placement of bulldog clamps on the vasculature, this is certainly justifying the necessity of surgical assistant.          ______________________________ Alexis Frock, MD     TM/MEDQ  D:  05/03/2016  T:  05/04/2016  Job:  [id number]

## 2016-05-04 NOTE — Care Management Note (Signed)
Case Management Note  Patient Details  Name: SHANICIA PETRASH MRN: [id number] Date of Birth: 06/19/1975  Subjective/Objective:   40 y/o f admitted w/renal mass. From home.                 Action/Plan:d/c plan home.   Expected Discharge Date:                  Expected Discharge Plan:  Home/Self Care  In-House Referral:     Discharge planning Services  CM Consult  Post Acute Care Choice:    Choice offered to:     DME Arranged:    DME Agency:     HH Arranged:    HH Agency:     Status of Service:  In process, will continue to follow  If discussed at Long Length of Stay Meetings, dates discussed:    Additional Comments:  Dessa Phi, RN 05/04/2016, 12:44 PM

## 2016-05-05 MED ORDER — SENNOSIDES-DOCUSATE SODIUM 8.6-50 MG PO TABS: 1.0000 | ORAL_TABLET | Freq: Two times a day (BID) | ORAL | 0 refills | Status: AC

## 2016-05-05 NOTE — Discharge Summary (Signed)
Physician Discharge Summary  Patient ID: Marcia Kirk MRN: [id number] DOB/AGE: 41-Mar-1977 41 y.o.  Admit date: 05/03/2016 Discharge date: 05/05/2016  Admission Diagnoses:  Discharge Diagnoses:  Active Problems:   Renal mass   Discharged Condition: good  Hospital Course:   1 - Left Renal Cancer - pt underwent LEFT robotic partial nephrectomy on 05/03/16, the day of admission, without acute complications. She was admitted to 4th floor Urology service post-op where she had uneventful in house recovery. Foley removed POD 1 and she voided w/o issue following. JP output scan following catheter removal so it was removed AM POD 2. By AM of POD 2 she is ambulatory, pain controlled with PO meds, tollerating PO intake, and felt to be adequate for discharge. Hgb at discharge >11. Cr at [place] <1. 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   Consults: None  Significant Diagnostic Studies: labs: as per above  Treatments: surgery:  LEFT robotic partial nephrectomy on 05/03/16  Discharge Exam: Blood pressure 108/65, pulse 94, temperature 99.5 F (37.5 C), temperature source Oral, resp. rate 18, height 5' 5"  (1.651 m), weight 86.6 kg (191 lb), last menstrual period 12/28/2015, SpO2 99 %. General appearance: alert, cooperative and appears stated age Eyes: negative Nose: Nares normal. Septum midline. Mucosa normal. No drainage or sinus tenderness. Throat: lips, mucosa, and tongue normal; teeth and gums normal Neck: supple, symmetrical, trachea midline Back: symmetric, no curvature. ROM normal. No CVA tenderness. Resp: non-labored on room air Cardio: Nl rate GI: soft, non-tender; bowel sounds normal; no masses,  no organomegaly Extremities: extremities normal, atraumatic, no cyanosis or edema Pulses: 2+ and symmetric Skin: Skin color, texture, turgor normal. No rashes or lesions Lymph nodes: Cervical,  supraclavicular, and axillary nodes normal. Neurologic: Grossly normal Incision/Wound: recent port / extraction sites c/d/i. JP with scant serosanguinous output, removed and dry dressing applied.   Disposition: 01-Home or Self Care   Allergies as of 05/05/2016      Reactions   Aleve [naproxen Sodium]    Naproxen Itching, Nausea Only   Can take ibuprofen   Other Hives   Derma Bond      Medication List    STOP taking these medications   folic acid 1 MG tablet Commonly known as:  FOLVITE   ibuprofen 200 MG tablet Commonly known as:  ADVIL,MOTRIN     TAKE these medications   acetaminophen 500 MG tablet Commonly known as:  TYLENOL Take 1,000 mg by mouth every 6 (six) hours as needed for moderate pain.   ALPRAZolam 0.5 MG tablet Commonly known as:  XANAX TAKE 1 TABLET TWICE DAILY FOR ANXIETY   cetirizine 10 MG tablet Commonly known as:  ZYRTEC Take 10 mg by mouth at bedtime.   diazepam 5 MG tablet Commonly known as:  VALIUM Take 1 tablet (5 mg total) by mouth 2 (two) times daily.   fluticasone 50 MCG/ACT nasal spray Commonly known as:  FLONASE Place 1 spray into both nostrils daily as needed for allergies or rhinitis.   HYDROmorphone HCl 8 MG T24a SR tablet Commonly known as:  EXALGO Take 8 mg by mouth at bedtime.   hyoscyamine 0.125 MG Tbdp disintergrating tablet Commonly known as:  ANASPAZ Place 0.125 mg under the tongue every 4 (four) hours as needed for cramping.   leuprolide 3.75 MG injection Commonly known as:  LUPRON Inject 3.75 mg into the muscle every 28 (twenty-eight) days. Due May 17, 2016  levothyroxine 75 MCG tablet Commonly known as:  SYNTHROID, LEVOTHROID Take 75 mcg by mouth daily.   lidocaine 5 % Commonly known as:  LIDODERM Place 1 patch onto the skin daily as needed (back pain). Remove & Discard patch within 12 hours or as directed by MD   Lidocaine-Hydrocortisone Ace 3-2.5 % Kit Place 1 application rectally 3 times/day as  needed-between meals & bedtime.   norethindrone 5 MG tablet Commonly known as:  AYGESTIN Take 5 mg by mouth at bedtime.   ondansetron 8 MG tablet Commonly known as:  ZOFRAN Take 8 mg by mouth daily as needed for nausea. Only with migraines about once/month   oxyCODONE 5 MG immediate release tablet Commonly known as:  Oxy IR/ROXICODONE Take 1-2 tablets (5-10 mg total) by mouth every 6 (six) hours as needed for moderate pain or severe pain. What changed:  how much to take  reasons to take this   ranitidine 150 MG capsule Commonly known as:  ZANTAC Take 150 mg by mouth 2 (two) times daily.   rizatriptan 10 MG tablet Commonly known as:  MAXALT Take 10 mg by mouth as needed for migraine. May repeat in 2 hours if needed for migraines   senna-docusate 8.6-50 MG tablet Commonly known as:  Senokot-S Take 1 tablet by mouth 2 (two) times daily. While taking additional pain meds to prevent constipation.   tiZANidine 4 MG tablet Commonly known as:  ZANAFLEX Take 2 mg by mouth every 8 (eight) hours as needed for muscle spasms.      Follow-up Information    Alexis Frock, MD On 05/18/2016.   Specialty:  Urology Why:  at 9:15 for MD visit. Dr. Tresa Moore will call you witih pathology results when available.  Contact information: [place] [address] [phone]           Signed: Alexis Frock 05/05/2016, 7:27 AM

## 2017-01-28 IMAGING — MR MR LUMBAR SPINE W/O CM
4 of 5 series · 18 of 48 positions shown · non-contrast
Comparison: None currently available

ADDENDUM:
Abdominal ultrasound 06/11/2008 was reviewed at time of
interpretation and is again revisited. Unfortunately, there is no
sonographic correlate for the potential left upper pole mass. There
is chart evidence of lumbar spine MRI 09/25/2011, 03/24/2015 and
lumbar CT 07/31/2014 (which may cover this area and be useful) but
these are not available.
CLINICAL DATA: Severe sharp pain in the left side radiating to the
leg since [REDACTED].

EXAM:
MRI LUMBAR SPINE WITHOUT CONTRAST
TECHNIQUE: Multiplanar, multisequence MR imaging of the lumbar spine was
performed. No intravenous contrast was administered.

[Series 3: T1 · sagittal · 4.0mm · 0.51mm/px · 3 of 14 slices shown (1 of 2)]
[im 3/14]
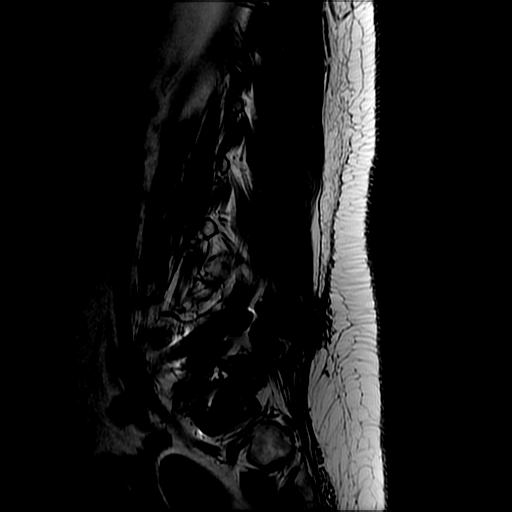
[im 8/14]
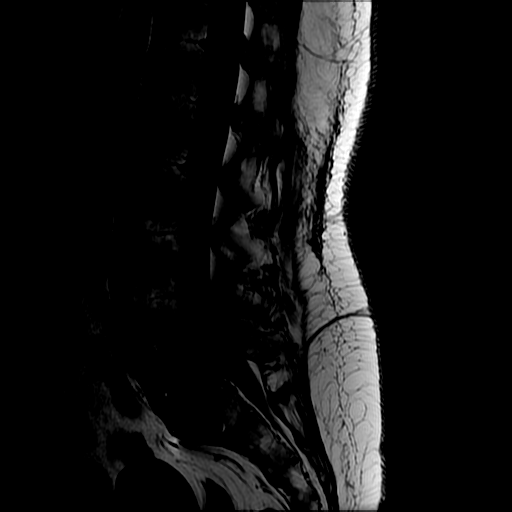
[im 14/14]
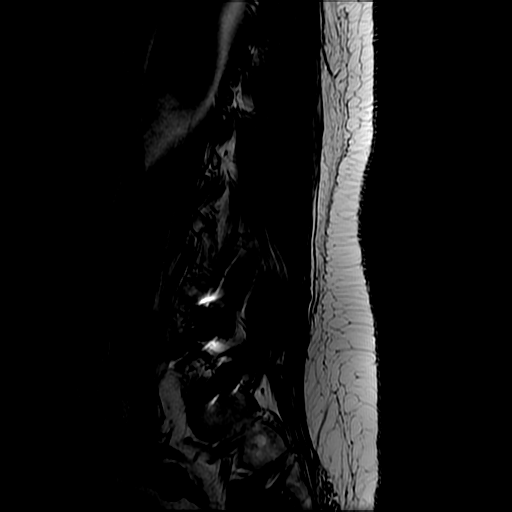

[Series 4: T2 post-contrast · sagittal · 4.0mm · 0.51mm/px · 5 of 14 slices shown]
[im 1/14]
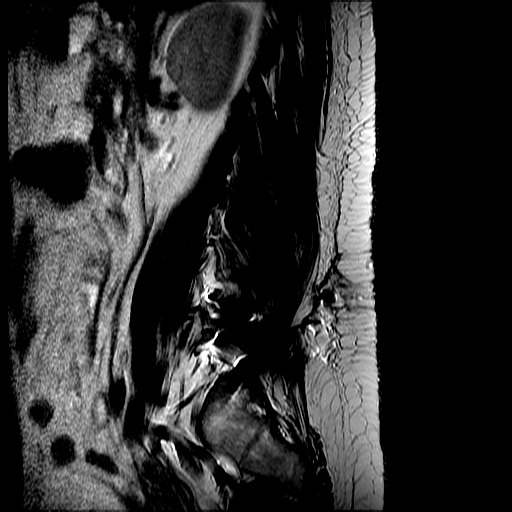
[im 4/14]
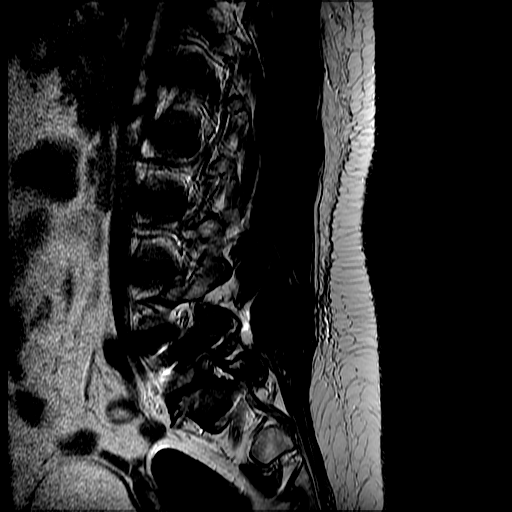
[im 7/14]
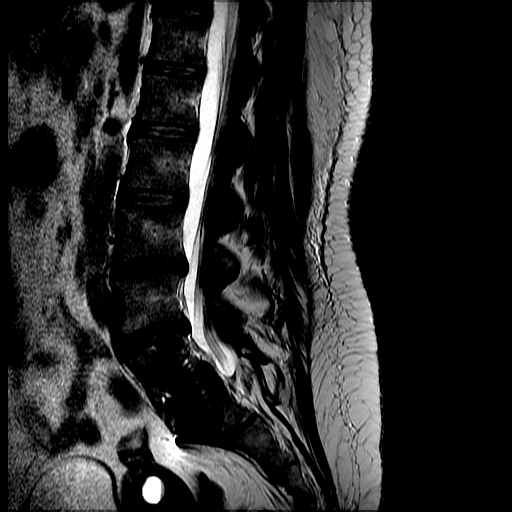
[im 10/14]
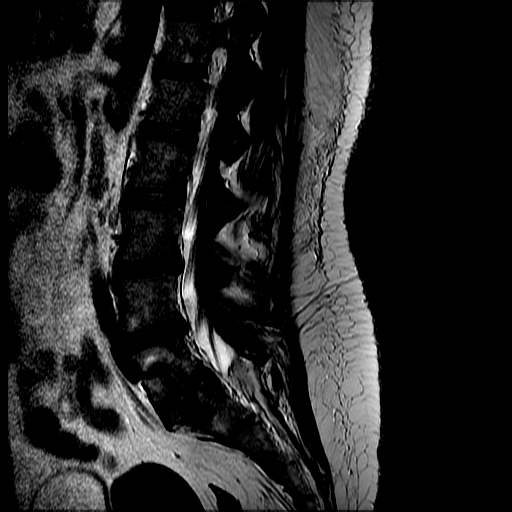
[im 14/14]
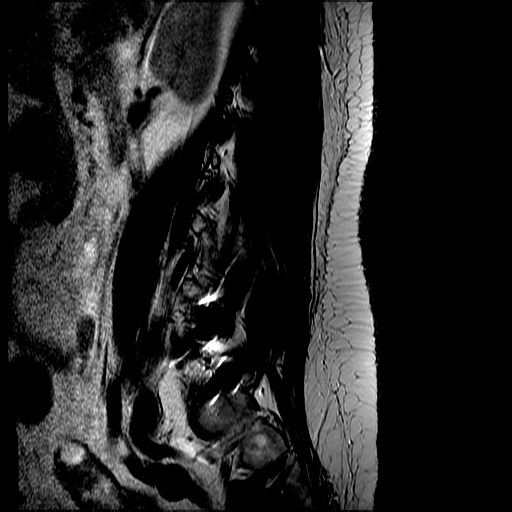

[Series 5: T2 · axial · 4.0mm · 0.41mm/px · z∈[-130,+68]mm · 7 of 46 slices shown]
[im 4/46]
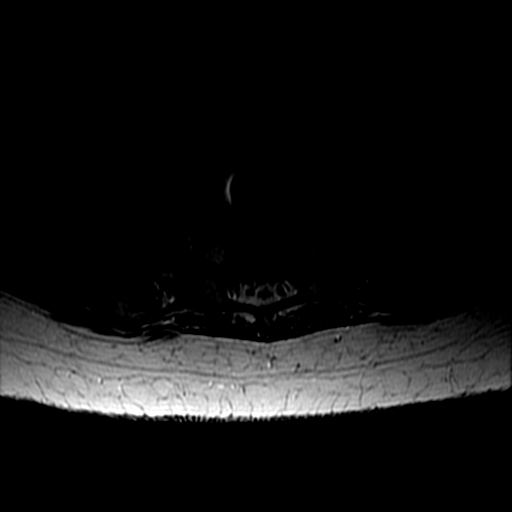
[im 7/46]
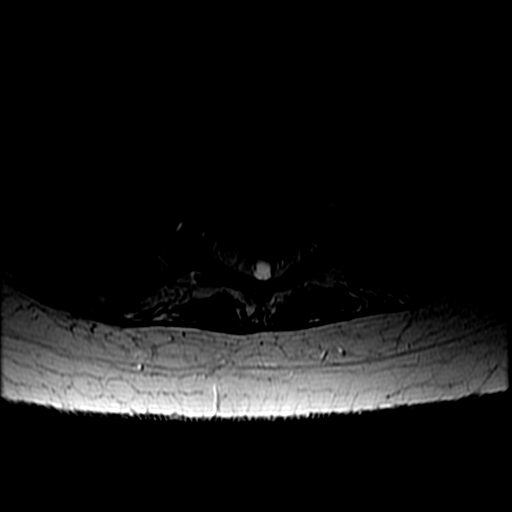
[im 10/46]
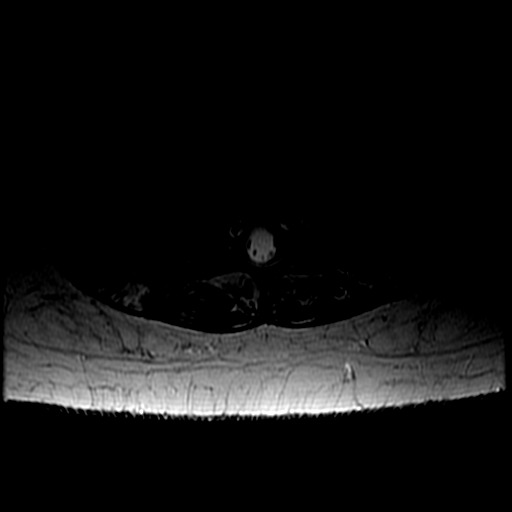
[im 16/46]
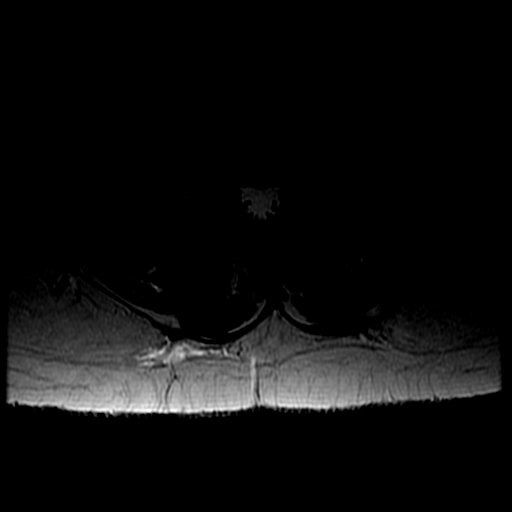
[im 22/46]
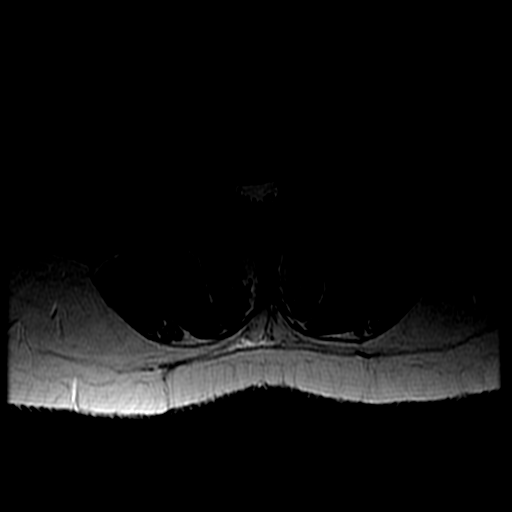
[im 25/46]
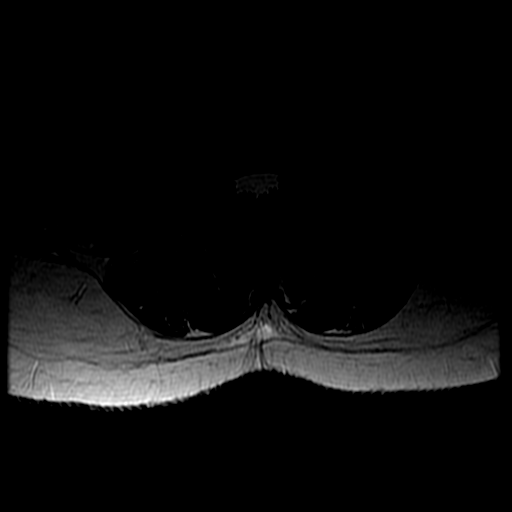
[im 40/46]
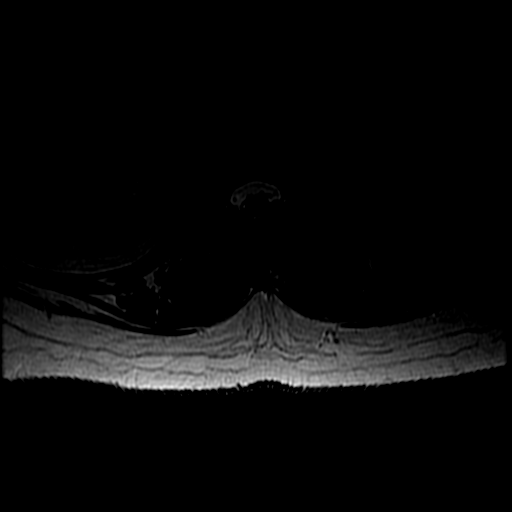

[Series 7: T1 · axial · 4.0mm · 0.41mm/px · z∈[-115,+68]mm · 3 of 46 slices shown (2 of 2)]
[im 7/46]
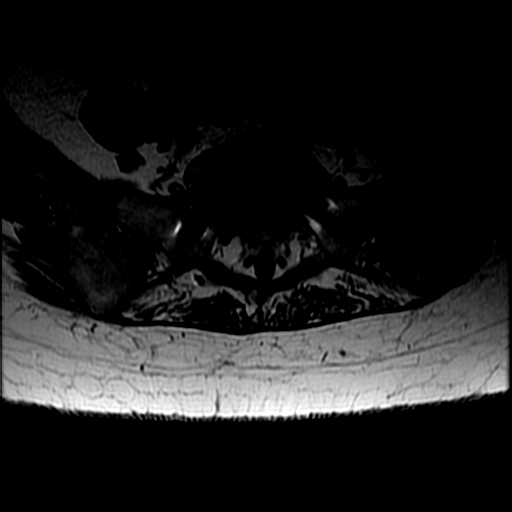
[im 25/46]
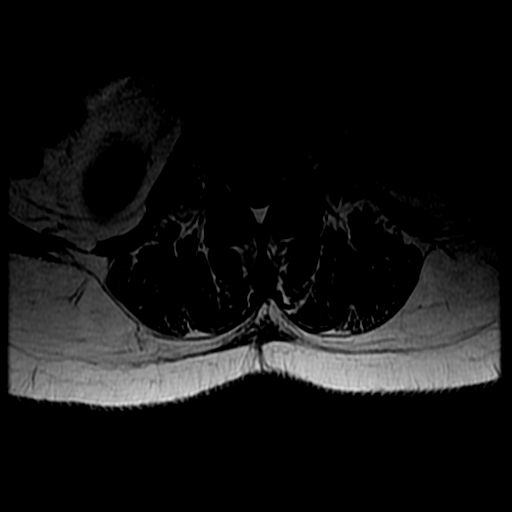
[im 40/46]
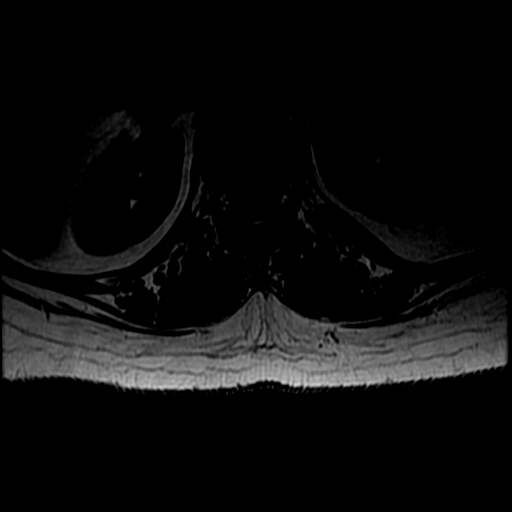

[18 of 48 positions shown; findings below may reference images not displayed]

FINDINGS: Segmentation:  Standard.

Alignment:  Slight anterolisthesis at L5-S1, status post fusion.

Vertebrae: No fracture, evidence of discitis, or bone lesion.
Bilateral L5 pars defect.

Conus medullaris: Extends to the L1-2 disc level and appears normal.

Paraspinal and other soft tissues: 14 mm cyst with mild T1
hyperintensity and fluid fluid level in the right uterine fundus.
Tiny presumed cyst seen in the right kidney and right liver. Rounded
2 cm partly seen isointense masslike finding contiguous with the
upper pole left kidney.

Disc levels:

T12- L1: Unremarkable.

L1-L2: Unremarkable.

L2-L3: Unremarkable.

L3-L4: Mild disc narrowing and right eccentric bulging. Negative
facets. No impingement

L4-L5: Small central disc protrusion superimposed on narrowed and
desiccated disc. Mild facet arthropathy. No impingement

L5-S1:Posterior-lateral fusion with rod and pedicle screws.
Intervertebral screw. On sagittal reformats there is effacement of
right foraminal fat concerning for herniation of residual disc, with
L5 impingement. The canal and left foramen are patent.
IMPRESSION: 1. No left-sided impingement to explain left leg symptoms.
2. Postoperative L5-S1 with right foraminal fat effacement which
could be disc or scar. Per outside report from [id number] graft material
was noted in this region. Contrast would be useful in
differentiating scar from disc if clinically needed.
3. Mild L3-4 and L4-5 disc degeneration without impingement.
4. Suspected 2 cm left renal or adrenal mass, but only seen on 1
slice. Enhanced CT or MRI may be needed, but would first confirm
presence with sonography given patient's young age.
5. 14 mm uterine cyst with fluid level. This could be trapped
endometrial fluid or sub endometrial cyst. Sonography could
visualize the adjacent endometrium.

## 2019-02-17 ENCOUNTER — Other Ambulatory Visit: Payer: Self-pay

## 2019-02-17 ENCOUNTER — Emergency Department (HOSPITAL_COMMUNITY)
Admission: EM | Admit: 2019-02-17 | Discharge: 2019-02-17 | Disposition: A | Discharge status: Home / Self Care | Payer: PRIVATE HEALTH INSURANCE | Attending: Emergency Medicine | Admitting: Emergency Medicine

## 2019-02-17 ENCOUNTER — Encounter (HOSPITAL_COMMUNITY): Payer: Self-pay

## 2019-02-17 DIAGNOSIS — S68117A Complete traumatic metacarpophalangeal amputation of left little finger, initial encounter: Secondary | ICD-10-CM | POA: Diagnosis not present

## 2019-02-17 DIAGNOSIS — E039 Hypothyroidism, unspecified: Secondary | ICD-10-CM | POA: Insufficient documentation

## 2019-02-17 DIAGNOSIS — Y281XXA Contact with knife, undetermined intent, initial encounter: Secondary | ICD-10-CM | POA: Insufficient documentation

## 2019-02-17 DIAGNOSIS — Y93G1 Activity, food preparation and clean up: Secondary | ICD-10-CM | POA: Insufficient documentation

## 2019-02-17 DIAGNOSIS — Z79899 Other long term (current) drug therapy: Secondary | ICD-10-CM | POA: Diagnosis not present

## 2019-02-17 DIAGNOSIS — Y999 Unspecified external cause status: Secondary | ICD-10-CM | POA: Insufficient documentation

## 2019-02-17 DIAGNOSIS — Y92000 Kitchen of unspecified non-institutional (private) residence as  the place of occurrence of the external cause: Secondary | ICD-10-CM | POA: Diagnosis not present

## 2019-02-17 DIAGNOSIS — S68119A Complete traumatic metacarpophalangeal amputation of unspecified finger, initial encounter: Secondary | ICD-10-CM

## 2019-02-17 MED ORDER — LIDOCAINE HCL 2 % IJ SOLN
10.0000 mL | Freq: Once | INTRAMUSCULAR | Status: AC
  Administered 2019-02-17: 200 mg via INTRADERMAL
  Filled 2019-02-17: qty 20

## 2019-02-17 NOTE — ED Provider Notes (Signed)
[facility] DEPT Provider Note   CSN: [id number] Arrival date & time: 02/17/19  2017     History   Chief Complaint Chief Complaint  Patient presents with  . Finger Injury    HPI Marcia Kirk is a 43 y.o. female.     The history is provided by the patient. No language interpreter was used.     43 year old female presenting for evaluation of finger injury.  Patient report approximately 2 hours ago she was prepping for dinner and while cutting potatoes with a knife she accidentally cut the tip of left pinky finger off.  She report acute onset of sharp pain currently rates as 3 out of 10, nonradiating, without any nail involvement.  She mention bleeding would not stop prompting her to come to the ER.  She is up-to-date with tetanus.  She is not on any blood thinner medication.  She is right-hand dominant.  Past Medical History:  Diagnosis Date  . Anxiety   . Endometriosis   . Family history of adverse reaction to anesthesia    post op n/v  . Headache(784.0)    migraines  . Heart murmur    very mild per pt  . Hypothyroidism   . Joint pain   . Pneumonia    43 years old  . PONV (postoperative nausea and vomiting)   . Urinary retention    after anesthesia    Patient Active Problem List   Diagnosis Date Noted  . 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 BACK SURGERY     L5-S1 fusion  . CHOLECYSTECTOMY  2010  . DIAGNOSTIC LAPAROSCOPY    . DILITATION & CURRETTAGE/HYSTROSCOPY WITH NOVASURE ABLATION N/A 01/01/2015   Procedure: DILATATION & CURETTAGE/HYSTEROSCOPY WITH NOVASURE ABLATION;  Surgeon: Servando Salina, MD;  Location: [facility];  Service: Gynecology;  Laterality: N/A;  IUD removal  . HYSTEROSCOPY W/D&C  01/04/2012   Procedure: DILATATION AND CURETTAGE /HYSTEROSCOPY;  Surgeon: Marvene Staff, MD;  Location: [facility];  Service: Gynecology;  Laterality: N/A;  . IUD REMOVAL  01/01/2015   Procedure:  INTRAUTERINE DEVICE (IUD) REMOVAL;  Surgeon: Servando Salina, MD;  Location: [facility];  Service: Gynecology;;  . LAPAROSCOPIC TUBAL LIGATION Bilateral 01/01/2015   Procedure: LAPAROSCOPIC TUBAL LIGATION With Cautery;  Surgeon: Servando Salina, MD;  Location: [facility];  Service: Gynecology;  Laterality: Bilateral;  . LAPAROSCOPY  01/04/2012   Procedure: LAPAROSCOPY DIAGNOSTIC;  Surgeon: Marvene Staff, MD;  Location: [facility];  Service: Gynecology;  Laterality: N/A;  . MM BONE DENSITY DEX AXIAL/SKEL ([facility] HX)     AXIAL LIFT WITH RODS AND SCREWS.Marland Kitchen3/11/16  . ROBOTIC ASSITED PARTIAL NEPHRECTOMY Left 05/03/2016   Procedure: XI ROBOTIC ASSITED PARTIAL NEPHRECTOMY;  Surgeon: Alexis Frock, MD;  Location: [facility];  Service: Urology;  Laterality: Left;  . TONSILLECTOMY    . TUBAL LIGATION       OB History   No obstetric history on file.      Home Medications    Prior to Admission medications   Medication Sig Start Date End Date Taking? Authorizing Provider  acetaminophen (TYLENOL) 500 MG tablet Take 1,000 mg by mouth every 6 (six) hours as needed for moderate pain.    Yes [provider]  Adalimumab (HUMIRA) 40 MG/0.8ML PSKT Inject 0.8 mLs into the skin once a week. 02/13/19 03/15/19 Yes [provider]  azelastine (ASTELIN) 0.1 % nasal spray Place 2 sprays into the nose 2 (  two) times daily. 10/30/18  Yes [provider]  busPIRone (BUSPAR) 10 MG tablet Take 10 mg by mouth at bedtime. 08/23/18  Yes [provider]  cetirizine (ZYRTEC) 10 MG tablet Take 10 mg by mouth at bedtime.    Yes [provider]  Cholecalciferol 100 MCG (4000 UT) CAPS Take 100 mcg by mouth daily.   Yes [provider]  folic acid (FOLVITE) 1 MG tablet Take 1 mg by mouth at bedtime. 08/14/16  Yes [provider]  frovatriptan (FROVA) 2.5 MG tablet Take 2.5 mg by mouth daily as needed. 10/30/18  Yes [provider]  HYDROmorphone HCl (EXALGO) 12 MG T24A  SR tablet Take 12 mg by mouth at bedtime.  04/18/16  Yes [provider]  letrozole (FEMARA) 2.5 MG tablet Take 2.5 mg by mouth daily. 10/20/16  Yes [provider]  levothyroxine (SYNTHROID, LEVOTHROID) 75 MCG tablet Take 75 mcg by mouth daily.   Yes [provider]  norethindrone (AYGESTIN) 5 MG tablet Take 5 mg by mouth at bedtime.  04/17/16  Yes [provider]  ondansetron (ZOFRAN) 8 MG tablet Take 8 mg by mouth daily as needed for nausea. Only with migraines about once/month   Yes [provider]  rizatriptan (MAXALT) 10 MG tablet Take 10 mg by mouth as needed for migraine. May repeat in 2 hours if needed for migraines   Yes [provider]  senna-docusate (SENOKOT-S) 8.6-50 MG tablet Take 1 tablet by mouth 2 (two) times daily. While taking additional pain meds to prevent constipation. 05/05/16  Yes Alexis Frock, MD  tiZANidine (ZANAFLEX) 4 MG tablet Take 4 mg by mouth at bedtime.    Yes [provider]  diazepam (VALIUM) 5 MG tablet Take 1 tablet (5 mg total) by mouth 2 (two) times daily. Patient not taking: Reported on 04/25/2016 12/20/15   Larene Pickett, PA-C  oxyCODONE (OXY IR/ROXICODONE) 5 MG immediate release tablet Take 1-2 tablets (5-10 mg total) by mouth every 6 (six) hours as needed for moderate pain or severe pain. Patient not taking: Reported on 02/17/2019 05/03/16   Debbrah Alar, PA-C    Family History Family History  Problem Relation Age of Onset  . Hypertension Mother   . Cancer Mother   . Hypertension Father   . Diabetes Father     Social History Social History   Tobacco Use  . Smoking status: Never Smoker  . Smokeless tobacco: Never Used  Substance Use Topics  . Alcohol use: Yes    Comment: rare  . Drug use: No     Allergies   Aleve [naproxen sodium], Naproxen, and Other   Review of Systems Review of Systems  Constitutional: Negative for fever.  Skin: Positive for wound.  Neurological:  Negative for numbness.     Physical Exam Updated Vital Signs BP (!) 139/98   Pulse 90   Resp 18   SpO2 100%   Physical Exam Vitals signs and nursing note reviewed.  Constitutional:      General: She is not in acute distress.    Appearance: She is well-developed.  HENT:     Head: Atraumatic.  Eyes:     Conjunctiva/sclera: Conjunctivae normal.  Neck:     Musculoskeletal: Neck supple.  Musculoskeletal:        General: Signs of injury (Left pinky finger: Complete skin avulsion of the tip of the finger without any nail involvement or bony involvement.) present.  Skin:    Findings: No rash.  Neurological:     Mental Status: She is alert.      ED Treatments / Results  Labs (all labs ordered are listed, but only abnormal results are displayed) Labs Reviewed - No data to display  EKG None  Radiology No results found.  Procedures .Marland KitchenLaceration Repair  Date/Time: 02/17/2019 9:53 PM Performed by: Domenic Moras, PA-C Authorized by: Domenic Moras, PA-C   Consent:    Consent obtained:  Verbal   Consent given by:  Patient   Risks discussed:  Infection, need for additional repair, pain, poor cosmetic result and poor wound healing   Alternatives discussed:  No treatment and delayed treatment Universal protocol:    Procedure explained and questions answered to patient or proxy's satisfaction: yes     Relevant documents present and verified: yes     Test results available and properly labeled: yes     Imaging studies available: yes     Required blood products, implants, devices, and special equipment available: yes     Site/side marked: yes     Immediately prior to procedure, a time out was called: yes     Patient identity confirmed:  Verbally with patient Anesthesia (see MAR for exact dosages):    Anesthesia method:  Nerve block   Block location:  Digital nerve block   Block needle gauge:  25 G   Block anesthetic:  Lidocaine 1% w/o epi   Block technique:  Digital nerve  block   Block injection procedure:  Introduced needle   Block outcome:  Anesthesia achieved Laceration details:    Location:  Finger   Finger location:  L small finger   Length (cm):  1   Depth (mm):  3 Repair type:    Repair type:  Intermediate Pre-procedure details:    Preparation:  Patient was prepped and draped in usual sterile fashion Exploration:    Hemostasis achieved with:  Cautery and tourniquet   Wound exploration: wound explored through full range of motion and entire depth of wound probed and visualized     Wound extent: no underlying fracture noted     Contaminated: no   Treatment:    Area cleansed with:  Betadine   Amount of cleaning:  Standard   Irrigation solution:  Sterile saline Skin repair:    Repair method: none, just cauterize. Post-procedure details:    Dressing:  Open (no dressing)   Patient tolerance of procedure:  Tolerated well, no immediate complications   (including critical care time)  Medications Ordered in ED Medications  lidocaine (XYLOCAINE) 2 % (with pres) injection 200 mg (has no administration in time range)     Initial Impression / Assessment and Plan / ED Course  I have reviewed the triage vital signs and the nursing notes.  Pertinent labs & imaging results that were available during my care of the patient were reviewed by me and considered in my medical decision making (see chart for details).        BP (!) 139/98   Pulse 90   Resp 18   SpO2 100%    Final Clinical Impressions(s) / ED Diagnoses   Final diagnoses:  Traumatic amputation of tip of finger of left hand    ED Discharge Orders    None     9:22 PM Patient here with a complete skin avulsion involving the tip of her pinky finger on the left hand.  Plan to anesthetize the finger and used Bovie cauterizer to provide hemostasis.  She is up-to-date  with tetanus.  9:55 PM Successful hemostasis after finger was cauterized.  Finger splint provided for protection.   Encourage patient to take the medication at home that she has been previously prescribed.  Appropriate wound care instruction given.   Domenic Moras, PA-C 02/17/19 [id number]    Daleen Bo, MD 02/18/19 [id number]

## 2019-02-17 NOTE — ED Triage Notes (Signed)
Pt accidentally cut off tip of L pinky finger.

## 2019-02-17 NOTE — ED Notes (Signed)
An After Visit Summary was printed and given to the patient. Discharge instructions given and no further questions at this time. Finger splint with kerlex applied to pinky.
